# Patient Record
Sex: Male | Born: 1935 | Race: White | Hispanic: No | Marital: Married | Smoking: Never smoker
Health system: Southern US, Community
[De-identification: ages and names within clinical notes are randomized; demographics above are authoritative.]

## PROBLEM LIST (undated history)

## (undated) DIAGNOSIS — I420 Dilated cardiomyopathy: Secondary | ICD-10-CM

## (undated) DIAGNOSIS — M1990 Unspecified osteoarthritis, unspecified site: Secondary | ICD-10-CM

## (undated) DIAGNOSIS — I209 Angina pectoris, unspecified: Secondary | ICD-10-CM

## (undated) DIAGNOSIS — E785 Hyperlipidemia, unspecified: Secondary | ICD-10-CM

## (undated) DIAGNOSIS — I5022 Chronic systolic (congestive) heart failure: Secondary | ICD-10-CM

## (undated) DIAGNOSIS — D649 Anemia, unspecified: Secondary | ICD-10-CM

## (undated) DIAGNOSIS — E039 Hypothyroidism, unspecified: Secondary | ICD-10-CM

## (undated) DIAGNOSIS — E559 Vitamin D deficiency, unspecified: Secondary | ICD-10-CM

## (undated) DIAGNOSIS — I447 Left bundle-branch block, unspecified: Secondary | ICD-10-CM

## (undated) DIAGNOSIS — I10 Essential (primary) hypertension: Secondary | ICD-10-CM

## (undated) DIAGNOSIS — I6523 Occlusion and stenosis of bilateral carotid arteries: Secondary | ICD-10-CM

## (undated) HISTORY — PX: COLONOSCOPY: SHX174

---

## 2004-04-22 ENCOUNTER — Ambulatory Visit: Payer: Self-pay | Admitting: Unknown Physician Specialty

## 2005-12-19 ENCOUNTER — Ambulatory Visit: Payer: Self-pay

## 2006-03-24 ENCOUNTER — Ambulatory Visit: Payer: Self-pay | Admitting: Internal Medicine

## 2008-04-12 ENCOUNTER — Ambulatory Visit: Payer: Self-pay | Admitting: Internal Medicine

## 2008-04-16 ENCOUNTER — Ambulatory Visit: Payer: Self-pay | Admitting: Unknown Physician Specialty

## 2008-05-04 ENCOUNTER — Ambulatory Visit: Payer: Self-pay | Admitting: Internal Medicine

## 2008-05-12 ENCOUNTER — Ambulatory Visit: Payer: Self-pay | Admitting: Internal Medicine

## 2008-06-12 ENCOUNTER — Ambulatory Visit: Payer: Self-pay | Admitting: Internal Medicine

## 2008-06-18 ENCOUNTER — Ambulatory Visit: Payer: Self-pay | Admitting: Internal Medicine

## 2008-07-13 ENCOUNTER — Ambulatory Visit: Payer: Self-pay | Admitting: Internal Medicine

## 2015-01-26 DIAGNOSIS — Z0000 Encounter for general adult medical examination without abnormal findings: Secondary | ICD-10-CM | POA: Diagnosis not present

## 2015-01-26 DIAGNOSIS — L570 Actinic keratosis: Secondary | ICD-10-CM | POA: Diagnosis not present

## 2015-01-26 DIAGNOSIS — E039 Hypothyroidism, unspecified: Secondary | ICD-10-CM | POA: Insufficient documentation

## 2015-04-12 DIAGNOSIS — L219 Seborrheic dermatitis, unspecified: Secondary | ICD-10-CM | POA: Diagnosis not present

## 2015-07-22 DIAGNOSIS — Z125 Encounter for screening for malignant neoplasm of prostate: Secondary | ICD-10-CM | POA: Diagnosis not present

## 2015-07-22 DIAGNOSIS — L570 Actinic keratosis: Secondary | ICD-10-CM | POA: Diagnosis not present

## 2015-07-22 DIAGNOSIS — E039 Hypothyroidism, unspecified: Secondary | ICD-10-CM | POA: Diagnosis not present

## 2015-07-22 DIAGNOSIS — Z0000 Encounter for general adult medical examination without abnormal findings: Secondary | ICD-10-CM | POA: Diagnosis not present

## 2015-07-29 DIAGNOSIS — I10 Essential (primary) hypertension: Secondary | ICD-10-CM | POA: Insufficient documentation

## 2015-07-29 DIAGNOSIS — E039 Hypothyroidism, unspecified: Secondary | ICD-10-CM | POA: Diagnosis not present

## 2016-03-28 DIAGNOSIS — I447 Left bundle-branch block, unspecified: Secondary | ICD-10-CM | POA: Insufficient documentation

## 2016-03-30 DIAGNOSIS — I208 Other forms of angina pectoris: Secondary | ICD-10-CM | POA: Diagnosis present

## 2016-03-30 DIAGNOSIS — I2089 Other forms of angina pectoris: Secondary | ICD-10-CM

## 2016-03-30 HISTORY — DX: Other forms of angina pectoris: I20.89

## 2016-03-31 ENCOUNTER — Encounter: Payer: Self-pay | Admitting: *Deleted

## 2016-03-31 ENCOUNTER — Ambulatory Visit
Admission: RE | Admit: 2016-03-31 | Discharge: 2016-03-31 | Disposition: A | Discharge status: Home / Self Care | Payer: Medicare Other | Source: Ambulatory Visit | Attending: Internal Medicine | Admitting: Internal Medicine

## 2016-03-31 ENCOUNTER — Encounter
Admission: RE | Disposition: A | Discharge status: Home / Self Care | Payer: Self-pay | Source: Ambulatory Visit | Attending: Internal Medicine

## 2016-03-31 DIAGNOSIS — I447 Left bundle-branch block, unspecified: Secondary | ICD-10-CM | POA: Insufficient documentation

## 2016-03-31 DIAGNOSIS — R079 Chest pain, unspecified: Secondary | ICD-10-CM | POA: Insufficient documentation

## 2016-03-31 DIAGNOSIS — Z87891 Personal history of nicotine dependence: Secondary | ICD-10-CM | POA: Diagnosis not present

## 2016-03-31 DIAGNOSIS — I2510 Atherosclerotic heart disease of native coronary artery without angina pectoris: Secondary | ICD-10-CM

## 2016-03-31 DIAGNOSIS — I208 Other forms of angina pectoris: Secondary | ICD-10-CM | POA: Diagnosis present

## 2016-03-31 DIAGNOSIS — M1990 Unspecified osteoarthritis, unspecified site: Secondary | ICD-10-CM | POA: Diagnosis not present

## 2016-03-31 DIAGNOSIS — I10 Essential (primary) hypertension: Secondary | ICD-10-CM | POA: Insufficient documentation

## 2016-03-31 DIAGNOSIS — I2089 Other forms of angina pectoris: Secondary | ICD-10-CM | POA: Diagnosis present

## 2016-03-31 HISTORY — DX: Angina pectoris, unspecified: I20.9

## 2016-03-31 HISTORY — DX: Hyperlipidemia, unspecified: E78.5

## 2016-03-31 HISTORY — DX: Hypothyroidism, unspecified: E03.9

## 2016-03-31 HISTORY — DX: Essential (primary) hypertension: I10

## 2016-03-31 HISTORY — DX: Atherosclerotic heart disease of native coronary artery without angina pectoris: I25.10

## 2016-03-31 HISTORY — DX: Unspecified osteoarthritis, unspecified site: M19.90

## 2016-03-31 HISTORY — PX: CARDIAC CATHETERIZATION: SHX172

## 2016-03-31 SURGERY — LEFT HEART CATH AND CORONARY ANGIOGRAPHY
Anesthesia: Moderate Sedation | Laterality: Left

## 2016-03-31 MED ORDER — HEPARIN (PORCINE) IN NACL 2-0.9 UNIT/ML-% IJ SOLN
INTRAMUSCULAR | Status: AC
  Filled 2016-03-31: qty 500

## 2016-03-31 MED ORDER — SODIUM CHLORIDE 0.9 % IV SOLN: 250.0000 mL | INTRAVENOUS | Status: DC | PRN

## 2016-03-31 MED ORDER — FENTANYL CITRATE (PF) 100 MCG/2ML IJ SOLN
INTRAMUSCULAR | Status: AC
  Filled 2016-03-31: qty 2

## 2016-03-31 MED ORDER — MIDAZOLAM HCL 2 MG/2ML IJ SOLN
INTRAMUSCULAR | Status: DC | PRN
  Administered 2016-03-31: 0.5 mg via INTRAVENOUS

## 2016-03-31 MED ORDER — ONDANSETRON HCL 4 MG/2ML IJ SOLN: 4.0000 mg | Freq: Four times a day (QID) | INTRAMUSCULAR | Status: DC | PRN

## 2016-03-31 MED ORDER — SODIUM CHLORIDE 0.9 % WEIGHT BASED INFUSION: 1.0000 mL/kg/h | INTRAVENOUS | Status: DC

## 2016-03-31 MED ORDER — SODIUM CHLORIDE 0.9 % WEIGHT BASED INFUSION
235.5000 mL/h | INTRAVENOUS | Status: DC
  Administered 2016-03-31: 3 mL/kg/h via INTRAVENOUS

## 2016-03-31 MED ORDER — SODIUM CHLORIDE 0.9% FLUSH: 3.0000 mL | Freq: Two times a day (BID) | INTRAVENOUS | Status: DC

## 2016-03-31 MED ORDER — MIDAZOLAM HCL 2 MG/2ML IJ SOLN
INTRAMUSCULAR | Status: AC
  Filled 2016-03-31: qty 2

## 2016-03-31 MED ORDER — ACETAMINOPHEN 325 MG PO TABS: 650.0000 mg | ORAL_TABLET | ORAL | Status: DC | PRN

## 2016-03-31 MED ORDER — IOPAMIDOL (ISOVUE-300) INJECTION 61%
INTRAVENOUS | Status: DC | PRN
  Administered 2016-03-31: 120 mL via INTRA_ARTERIAL

## 2016-03-31 MED ORDER — SODIUM CHLORIDE 0.9% FLUSH: 3.0000 mL | INTRAVENOUS | Status: DC | PRN

## 2016-03-31 MED ORDER — SODIUM CHLORIDE 0.9 % WEIGHT BASED INFUSION: 3.0000 mL/kg/h | INTRAVENOUS | Status: DC

## 2016-03-31 MED ORDER — ASPIRIN 81 MG PO CHEW: 81.0000 mg | CHEWABLE_TABLET | ORAL | Status: DC

## 2016-03-31 SURGICAL SUPPLY — 9 items
CATH 5FR JL4 DIAGNOSTIC (CATHETERS) ×3 IMPLANT
CATH INFINITI 5FR ANG PIGTAIL (CATHETERS) ×3 IMPLANT
CATH INFINITI JR4 5F (CATHETERS) ×3 IMPLANT
DEVICE CLOSURE MYNXGRIP 5F (Vascular Products) ×3 IMPLANT
KIT MANI 3VAL PERCEP (MISCELLANEOUS) ×3 IMPLANT
NEEDLE PERC 18GX7CM (NEEDLE) ×3 IMPLANT
PACK CARDIAC CATH (CUSTOM PROCEDURE TRAY) ×3 IMPLANT
SHEATH PINNACLE 5F 10CM (SHEATH) ×3 IMPLANT
WIRE EMERALD 3MM-J .035X150CM (WIRE) ×3 IMPLANT

## 2016-03-31 NOTE — Progress Notes (Signed)
MD notified of patient's pulse 50s since admission pre-op and 40-50s post-op. Patient's home medication include metoprolol BID. MD order to stop medication until patient's follow up in office in 2 weeks.

## 2016-04-07 DIAGNOSIS — E782 Mixed hyperlipidemia: Secondary | ICD-10-CM | POA: Insufficient documentation

## 2016-04-07 DIAGNOSIS — I2510 Atherosclerotic heart disease of native coronary artery without angina pectoris: Secondary | ICD-10-CM | POA: Insufficient documentation

## 2016-04-27 DIAGNOSIS — I420 Dilated cardiomyopathy: Secondary | ICD-10-CM | POA: Insufficient documentation

## 2018-02-26 ENCOUNTER — Ambulatory Visit: Payer: Medicare HMO | Admitting: Certified Registered Nurse Anesthetist

## 2018-02-26 ENCOUNTER — Ambulatory Visit
Admission: RE | Admit: 2018-02-26 | Discharge: 2018-02-26 | Disposition: A | Discharge status: Home / Self Care | Payer: Medicare HMO | Source: Ambulatory Visit | Attending: Internal Medicine | Admitting: Internal Medicine

## 2018-02-26 ENCOUNTER — Encounter: Payer: Self-pay | Admitting: *Deleted

## 2018-02-26 ENCOUNTER — Encounter
Admission: RE | Disposition: A | Discharge status: Home / Self Care | Payer: Self-pay | Source: Ambulatory Visit | Attending: Internal Medicine

## 2018-02-26 DIAGNOSIS — K208 Other esophagitis: Secondary | ICD-10-CM | POA: Insufficient documentation

## 2018-02-26 DIAGNOSIS — R1013 Epigastric pain: Secondary | ICD-10-CM | POA: Diagnosis not present

## 2018-02-26 DIAGNOSIS — K449 Diaphragmatic hernia without obstruction or gangrene: Secondary | ICD-10-CM | POA: Insufficient documentation

## 2018-02-26 DIAGNOSIS — R634 Abnormal weight loss: Secondary | ICD-10-CM | POA: Diagnosis not present

## 2018-02-26 DIAGNOSIS — I10 Essential (primary) hypertension: Secondary | ICD-10-CM | POA: Diagnosis not present

## 2018-02-26 DIAGNOSIS — Z79899 Other long term (current) drug therapy: Secondary | ICD-10-CM | POA: Insufficient documentation

## 2018-02-26 DIAGNOSIS — K2960 Other gastritis without bleeding: Secondary | ICD-10-CM | POA: Insufficient documentation

## 2018-02-26 DIAGNOSIS — Z6824 Body mass index (BMI) 24.0-24.9, adult: Secondary | ICD-10-CM | POA: Diagnosis not present

## 2018-02-26 DIAGNOSIS — R1310 Dysphagia, unspecified: Secondary | ICD-10-CM | POA: Diagnosis not present

## 2018-02-26 HISTORY — PX: ESOPHAGOGASTRODUODENOSCOPY (EGD) WITH PROPOFOL: SHX5813

## 2018-02-26 SURGERY — ESOPHAGOGASTRODUODENOSCOPY (EGD) WITH PROPOFOL
Anesthesia: General

## 2018-02-26 MED ORDER — PROPOFOL 10 MG/ML IV BOLUS
INTRAVENOUS | Status: DC | PRN
  Administered 2018-02-26: 20 mg via INTRAVENOUS
  Administered 2018-02-26: 50 mg via INTRAVENOUS

## 2018-02-26 MED ORDER — LIDOCAINE HCL (PF) 2 % IJ SOLN
INTRAMUSCULAR | Status: AC
  Filled 2018-02-26: qty 10

## 2018-02-26 MED ORDER — PROPOFOL 500 MG/50ML IV EMUL
INTRAVENOUS | Status: AC
  Filled 2018-02-26: qty 50

## 2018-02-26 MED ORDER — LIDOCAINE HCL (CARDIAC) PF 100 MG/5ML IV SOSY
PREFILLED_SYRINGE | INTRAVENOUS | Status: DC | PRN
  Administered 2018-02-26: 100 mg via INTRAVENOUS

## 2018-02-26 MED ORDER — PROPOFOL 500 MG/50ML IV EMUL
INTRAVENOUS | Status: DC | PRN
  Administered 2018-02-26: 150 ug/kg/min via INTRAVENOUS

## 2018-02-26 MED ORDER — SODIUM CHLORIDE 0.9 % IV SOLN
INTRAVENOUS | Status: DC
  Administered 2018-02-26: 1000 mL via INTRAVENOUS

## 2018-02-26 NOTE — Interval H&P Note (Signed)
History and Physical Interval Note:  02/26/2018 9:24 AM  Joel Spencer  has presented today for surgery, with the diagnosis of GERD DYSPHAGIA EPIG PAIN WT LOSS  The various methods of treatment have been discussed with the patient and family. After consideration of risks, benefits and other options for treatment, the patient has consented to  Procedure(s): ESOPHAGOGASTRODUODENOSCOPY (EGD) WITH PROPOFOL (N/A) as a surgical intervention .  The patient's history has been reviewed, patient examined, no change in status, stable for surgery.  I have reviewed the patient's chart and labs.  Questions were answered to the patient's satisfaction.     [place], [place]

## 2018-02-26 NOTE — Anesthesia Postprocedure Evaluation (Signed)
Anesthesia Post Note  Patient: Joel Spencer  Procedure(s) Performed: ESOPHAGOGASTRODUODENOSCOPY (EGD) WITH PROPOFOL (N/A )  Patient location during evaluation: Endoscopy Anesthesia Type: General Level of consciousness: awake and alert Pain management: pain level controlled Vital Signs Assessment: post-procedure vital signs reviewed and stable Respiratory status: spontaneous breathing, nonlabored ventilation, respiratory function stable and patient connected to nasal cannula oxygen Cardiovascular status: blood pressure returned to baseline and stable Postop Assessment: no apparent nausea or vomiting Anesthetic complications: no     Last Vitals:  Vitals:   02/26/18 0939 02/26/18 1035  BP: (!) 165/97 113/76  Pulse: (!) 56 (!) 57  Resp: 16 18  Temp: 36.7 C (!) 36.1 C  SpO2: 100% 97%    Last Pain:  Vitals:   02/26/18 1116  TempSrc:   PainSc: 0-No pain                 Cleda MccreedyJoseph K Travis Mastel

## 2018-02-26 NOTE — H&P (Signed)
Outpatient short stay form Pre-procedure 02/26/2018 9:22 AM Racine Erby K. Norma Fredricksonoledo, M.D.  Primary Physician: Reuben LikesVishwananth Hande, M.D.  Reason for visit: Epigastric pain , weight loss, dysphagia  History of present illness:  82 y/o male presents with the above complaints. 30 lb weight loss and solid food Has epigastric pain with spicyt foods and dysphagia to the mid sternum.   No current facility-administered medications for this encounter.   Medications Prior to Admission  Medication Sig Dispense Refill Last Dose  . amLODipine (NORVASC) 5 MG tablet Take 5 mg by mouth daily.   03/31/2016 at Unknown time  . desonide (DESOWEN) 0.05 % ointment Apply 1 application topically 2 (two) times daily as needed.   Past Week at Unknown time  . isosorbide mononitrate (IMDUR) 30 MG 24 hr tablet Take 30 mg by mouth daily.   03/31/2016 at Unknown time  . ketoconazole (NIZORAL) 2 % cream Apply 1 application topically daily as needed for irritation.   Past Week at Unknown time  . metoprolol tartrate (LOPRESSOR) 25 MG tablet Take 25 mg by mouth 2 (two) times daily.   03/31/2016 at Unknown time     No Known Allergies   Past Medical History:  Diagnosis Date  . Anginal pain (HCC)   . Arthritis   . Hyperlipemia   . Hypertension   . Hypothyroidism     Review of systems:  Otherwise negative.    Physical Exam  Gen: Alert, oriented. Appears stated age.  HEENT: [place]/AT. PERRLA. Lungs: CTA, no wheezes. CV: RR nl S1, S2. Abd: soft, benign, no masses. BS+ Ext: No edema. Pulses 2+    Planned procedures: Proceed with EGD and colonoscopy. The patient understands the nature of the planned procedure, indications, risks, alternatives and potential complications including but not limited to bleeding, infection, perforation, damage to internal organs and possible oversedation/side effects from anesthesia. The patient agrees and gives consent to proceed.  Please refer to procedure notes for findings, recommendations  and patient disposition/instructions.     Shayde Gervacio K. Norma Fredricksonoledo, M.D. Gastroenterology 02/26/2018  9:22 AM

## 2018-02-26 NOTE — Anesthesia Procedure Notes (Signed)
Performed by: Louvina Cleary, CRNA Pre-anesthesia Checklist: Patient identified, Emergency Drugs available, Suction available, Patient being monitored and Timeout performed Patient Re-evaluated:Patient Re-evaluated prior to induction Oxygen Delivery Method: Nasal cannula Induction Type: IV induction       

## 2018-02-26 NOTE — Op Note (Signed)
[facility] Gastroenterology Patient Name: Joel Spencer Procedure Date: 02/26/2018 10:21 AM MRN: [id number] Account #: [id number] Date of Birth: 1936/05/21 Admit Type: Outpatient Age: 82 Room: [facility] ENDO ROOM 3 Gender: Male Note Status: Finalized Procedure:            Upper GI endoscopy Indications:          Epigastric abdominal pain, Dysphagia, Weight loss Providers:            Boykin Nearing. Norma Fredrickson MD, MD Referring MD:         Barbette Reichmann, MD (Referring MD) Medicines:            Propofol per Anesthesia Complications:        No immediate complications. Procedure:            Pre-Anesthesia Assessment:                       - The risks and benefits of the procedure and the                        sedation options and risks were discussed with the                        patient. All questions were answered and informed                        consent was obtained.                       - Patient identification and proposed procedure were                        verified prior to the procedure by the nurse. The                        procedure was verified in the procedure room.                       - ASA Grade Assessment: II - A patient with mild                        systemic disease.                       - After reviewing the risks and benefits, the patient                        was deemed in satisfactory condition to undergo the                        procedure.                       After obtaining informed consent, the endoscope was                        passed under direct vision. Throughout the procedure,                        the patient's blood pressure, pulse, and oxygen  saturations were monitored continuously. The Endoscope                        was introduced through the mouth, and advanced to the                        third part of duodenum. The upper GI endoscopy was                        accomplished without difficulty.  The patient tolerated                        the procedure well. Findings:      Mucosal changes including feline appearance were found in the middle       third of the esophagus and in the lower third of the esophagus.       Esophageal findings were graded using the Eosinophilic Esophagitis       Endoscopic Reference Score (EoE-EREFS) as: Edema Grade 0 Normal       (distinct vascular markings), Rings Grade 1 Mild (subtle circumferential       ridges seen on esophageal distension), Exudates Grade 0 None (no white       lesions seen), Furrows Grade 0 None (no vertical lines seen) and       Stricture none (no stricture found). Biopsies were obtained from the       proximal and distal esophagus with cold forceps for histology of       suspected eosinophilic esophagitis.      Localized moderate inflammation characterized by congestion (edema) and       erythema was found in the gastric antrum. Biopsies were taken with a       cold forceps for Helicobacter pylori testing.      A 2 cm hiatal hernia was present.      The examined duodenum was normal.      The exam was otherwise without abnormality. Impression:           - Esophageal mucosal changes suspicious for                        eosinophilic esophagitis. Biopsied.                       - Gastritis. Biopsied.                       - 2 cm hiatal hernia.                       - Normal examined duodenum.                       - The examination was otherwise normal. Recommendation:       - Patient has a contact number available for                        emergencies. The signs and symptoms of potential                        delayed complications were discussed with the patient.  Return to normal activities tomorrow. Written discharge                        instructions were provided to the patient.                       - Resume previous diet.                       - Continue present medications.                        - Await pathology results.                       - Return to physician assistant in 1 month.                       - The findings and recommendations were discussed with                        the patient and their family. Procedure Code(s):    --- Professional ---                       [phone], Esophagogastroduodenoscopy, flexible, transoral;                        with biopsy, single or multiple Diagnosis Code(s):    --- Professional ---                       R63.4, Abnormal weight loss                       R13.10, Dysphagia, unspecified                       R10.13, Epigastric pain                       K44.9, Diaphragmatic hernia without obstruction or                        gangrene                       K29.70, Gastritis, unspecified, without bleeding                       K22.8, Other specified diseases of esophagus CPT copyright 2017 American Medical Association. All rights reserved. The codes documented in this report are preliminary and upon coder review may  be revised to meet current compliance requirements. Stanton Kidneyeodoro K Violet Cart MD, MD 02/26/2018 10:36:47 AM This report has been signed electronically. Number of Addenda: 0 Note Initiated On: 02/26/2018 10:21 AM      [facility]

## 2018-02-26 NOTE — Transfer of Care (Signed)
Immediate Anesthesia Transfer of Care Note  Patient: Joel Spencer  Procedure(s) Performed: ESOPHAGOGASTRODUODENOSCOPY (EGD) WITH PROPOFOL (N/A )  Patient Location: PACU  Anesthesia Type:General  Level of Consciousness: drowsy  Airway & Oxygen Therapy: Patient Spontanous Breathing  Post-op Assessment: Report given to RN and Post -op Vital signs reviewed and stable  Post vital signs: Reviewed and stable  Last Vitals:  Vitals Value Taken Time  BP    Temp    Pulse 59 02/26/2018 10:35 AM  Resp 14 02/26/2018 10:35 AM  SpO2 99 % 02/26/2018 10:35 AM  Vitals shown include unvalidated device data.  Last Pain:  Vitals:   02/26/18 0939  TempSrc: Oral  PainSc: 0-No pain         Complications: No apparent anesthesia complications

## 2018-02-26 NOTE — Anesthesia Post-op Follow-up Note (Signed)
Anesthesia QCDR form completed.        

## 2018-02-26 NOTE — Anesthesia Preprocedure Evaluation (Signed)
Anesthesia Evaluation  Patient identified by MRN, date of birth, ID band Patient awake    Reviewed: Allergy & Precautions, H&P , NPO status , Patient's Chart, lab work & pertinent test results  History of Anesthesia Complications Negative for: history of anesthetic complications  Airway Mallampati: III  TM Distance: <3 FB Neck ROM: limited    Dental  (+) Chipped, Upper Dentures, Poor Dentition, Missing, Partial Lower   Pulmonary neg pulmonary ROS, neg shortness of breath,           Cardiovascular Exercise Tolerance: Good hypertension, (-) angina     Neuro/Psych negative neurological ROS  negative psych ROS   GI/Hepatic negative GI ROS, Neg liver ROS,   Endo/Other  Hypothyroidism   Renal/GU negative Renal ROS  negative genitourinary   Musculoskeletal  (+) Arthritis ,   Abdominal   Peds  Hematology negative hematology ROS (+)   Anesthesia Other Findings Past Medical History: No date: Anginal pain (HCC) No date: Arthritis No date: Hyperlipemia No date: Hypertension No date: Hypothyroidism  Past Surgical History: 03/31/2016: CARDIAC CATHETERIZATION; Left     Comment:  Procedure: Left Heart Cath and Coronary Angiography;                Surgeon: Lamar BlinksBruce J Kowalski, MD;  Location: [facility] INVASIVE               CV LAB;  Service: Cardiovascular;  Laterality: Left;  BMI    Body Mass Index:  24.02 kg/m      Reproductive/Obstetrics negative OB ROS                             Anesthesia Physical Anesthesia Plan  ASA: III  Anesthesia Plan: General   Post-op Pain Management:    Induction: Intravenous  PONV Risk Score and Plan: Propofol infusion and TIVA  Airway Management Planned: Natural Airway and Nasal Cannula  Additional Equipment:   Intra-op Plan:   Post-operative Plan:   Informed Consent: I have reviewed the patients History and Physical, chart, labs and discussed the  procedure including the risks, benefits and alternatives for the proposed anesthesia with the patient or authorized representative who has indicated his/her understanding and acceptance.   Dental Advisory Given  Plan Discussed with: Anesthesiologist, CRNA and Surgeon  Anesthesia Plan Comments: (Patient consented for risks of anesthesia including but not limited to:  - adverse reactions to medications - risk of intubation if required - damage to teeth, lips or other oral mucosa - sore throat or hoarseness - Damage to heart, brain, lungs or loss of life  Patient voiced understanding.)        Anesthesia Quick Evaluation

## 2018-02-27 ENCOUNTER — Other Ambulatory Visit: Payer: Self-pay | Admitting: Internal Medicine

## 2018-02-28 LAB — SURGICAL PATHOLOGY

## 2018-04-11 DIAGNOSIS — I6523 Occlusion and stenosis of bilateral carotid arteries: Secondary | ICD-10-CM | POA: Insufficient documentation

## 2019-01-10 DIAGNOSIS — I5022 Chronic systolic (congestive) heart failure: Secondary | ICD-10-CM | POA: Insufficient documentation

## 2019-03-12 DIAGNOSIS — Z95810 Presence of automatic (implantable) cardiac defibrillator: Secondary | ICD-10-CM

## 2019-03-12 HISTORY — PX: CARDIAC DEFIBRILLATOR PLACEMENT: SHX171

## 2019-03-12 HISTORY — DX: Presence of automatic (implantable) cardiac defibrillator: Z95.810

## 2019-04-07 ENCOUNTER — Encounter: Payer: Self-pay | Admitting: Emergency Medicine

## 2019-04-07 ENCOUNTER — Emergency Department
Admission: EM | Admit: 2019-04-07 | Discharge: 2019-04-07 | Disposition: A | Discharge status: Home / Self Care | Payer: Medicare HMO | Attending: Emergency Medicine | Admitting: Emergency Medicine

## 2019-04-07 DIAGNOSIS — R109 Unspecified abdominal pain: Secondary | ICD-10-CM | POA: Diagnosis present

## 2019-04-07 DIAGNOSIS — Z5321 Procedure and treatment not carried out due to patient leaving prior to being seen by health care provider: Secondary | ICD-10-CM | POA: Insufficient documentation

## 2019-04-07 NOTE — ED Triage Notes (Signed)
Pt c/o left lower abdominal pain that radiates into the back. Symptoms started this afternoon and have been worse tonight. Pt had 1 episode of emesis.

## 2019-04-16 DIAGNOSIS — Z4502 Encounter for adjustment and management of automatic implantable cardiac defibrillator: Secondary | ICD-10-CM | POA: Insufficient documentation

## 2019-08-07 DIAGNOSIS — E7800 Pure hypercholesterolemia, unspecified: Secondary | ICD-10-CM | POA: Insufficient documentation

## 2020-01-23 DIAGNOSIS — Z8616 Personal history of COVID-19: Secondary | ICD-10-CM

## 2020-01-23 HISTORY — DX: Personal history of COVID-19: Z86.16

## 2020-02-02 ENCOUNTER — Ambulatory Visit (HOSPITAL_COMMUNITY)
Admission: RE | Admit: 2020-02-02 | Discharge: 2020-02-02 | Disposition: A | Discharge status: Home / Self Care | Payer: Medicare Other | Source: Ambulatory Visit | Attending: Pulmonary Disease | Admitting: Pulmonary Disease

## 2020-02-02 ENCOUNTER — Other Ambulatory Visit (HOSPITAL_COMMUNITY): Payer: Self-pay | Admitting: Oncology

## 2020-02-02 DIAGNOSIS — Z23 Encounter for immunization: Secondary | ICD-10-CM | POA: Insufficient documentation

## 2020-02-02 DIAGNOSIS — U071 COVID-19: Secondary | ICD-10-CM | POA: Diagnosis present

## 2020-02-02 MED ORDER — SODIUM CHLORIDE 0.9 % IV SOLN: INTRAVENOUS | Status: DC | PRN

## 2020-02-02 MED ORDER — ALBUTEROL SULFATE HFA 108 (90 BASE) MCG/ACT IN AERS: 2.0000 | INHALATION_SPRAY | Freq: Once | RESPIRATORY_TRACT | Status: DC | PRN

## 2020-02-02 MED ORDER — EPINEPHRINE 0.3 MG/0.3ML IJ SOAJ: 0.3000 mg | Freq: Once | INTRAMUSCULAR | Status: DC | PRN

## 2020-02-02 MED ORDER — FAMOTIDINE IN NACL 20-0.9 MG/50ML-% IV SOLN: 20.0000 mg | Freq: Once | INTRAVENOUS | Status: DC | PRN

## 2020-02-02 MED ORDER — SODIUM CHLORIDE 0.9 % IV SOLN
1200.0000 mg | Freq: Once | INTRAVENOUS | Status: AC
  Administered 2020-02-02: 1200 mg via INTRAVENOUS
  Filled 2020-02-02: qty 10

## 2020-02-02 MED ORDER — METHYLPREDNISOLONE SODIUM SUCC 125 MG IJ SOLR: 125.0000 mg | Freq: Once | INTRAMUSCULAR | Status: DC | PRN

## 2020-02-02 MED ORDER — DIPHENHYDRAMINE HCL 50 MG/ML IJ SOLN: 50.0000 mg | Freq: Once | INTRAMUSCULAR | Status: DC | PRN

## 2020-02-02 NOTE — Progress Notes (Signed)
I connected by phone with  Mr. Hogeland at 3:30pm on 02/02/20 to discuss the potential use of an new treatment for mild to moderate COVID-19 viral infection in non-hospitalized patients.   This patient is a age/sex that meets the FDA criteria for Emergency Use Authorization of casirivimab\imdevimab.  Has a (+) direct SARS-CoV-2 viral test result 1. Has mild or moderate COVID-19  2. Is ? 82 years of age and weighs ? 40 kg 3. Is NOT hospitalized due to COVID-19 4. Is NOT requiring oxygen therapy or requiring an increase in baseline oxygen flow rate due to COVID-19 5. Is within 10 days of symptom onset 6. Has at least one of the high risk factor(s) for progression to severe COVID-19 and/or hospitalization as defined in EUA. ? Specific high risk criteria :age 10   Symptom onset  01/25/20   I have spoken and communicated the following to the patient or parent/caregiver:   1. FDA has authorized the emergency use of casirivimab\imdevimab for the treatment of mild to moderate COVID-19 in adults and pediatric patients with positive results of direct SARS-CoV-2 viral testing who are 45 years of age and older weighing at least 40 kg, and who are at high risk for progressing to severe COVID-19 and/or hospitalization.   2. The significant known and potential risks and benefits of casirivimab\imdevimab, and the extent to which such potential risks and benefits are unknown.   3. Information on available alternative treatments and the risks and benefits of those alternatives, including clinical trials.   4. Patients treated with casirivimab\imdevimab should continue to self-isolate and use infection control measures (e.g., wear mask, isolate, social distance, avoid sharing personal items, clean and disinfect "high touch" surfaces, and frequent handwashing) according to CDC guidelines.    5. The patient or parent/caregiver has the option to accept or refuse casirivimab\imdevimab .   After reviewing this  information with the patient, The patient agreed to proceed with receiving casirivimab\imdevimab infusion and will be provided a copy of the Fact sheet prior to receiving the infusion.Mignon Pine, AGNP-C [phone] (Infusion Center Hotline)

## 2020-02-02 NOTE — Progress Notes (Signed)
  Diagnosis: COVID-19  Physician:DR Wright  Procedure: [facility] Med: casirivimab\imdevimab infusion - Provided patient with casirivimab\imdevimab fact sheet for patients, parents and caregivers prior to infusion.  Complications: No immediate complications noted.  Discharge: Discharged home   Melizza Kanode Ramos 02/02/2020  

## 2020-02-02 NOTE — Discharge Instructions (Signed)

## 2020-05-17 DIAGNOSIS — Z8616 Personal history of COVID-19: Secondary | ICD-10-CM | POA: Insufficient documentation

## 2020-09-14 ENCOUNTER — Other Ambulatory Visit: Payer: Self-pay | Admitting: Internal Medicine

## 2020-09-14 DIAGNOSIS — R1032 Left lower quadrant pain: Secondary | ICD-10-CM

## 2020-09-14 DIAGNOSIS — R634 Abnormal weight loss: Secondary | ICD-10-CM

## 2020-09-22 ENCOUNTER — Ambulatory Visit: Payer: Medicare Other

## 2020-11-11 ENCOUNTER — Other Ambulatory Visit: Payer: Self-pay | Admitting: Internal Medicine

## 2020-11-11 DIAGNOSIS — Z8616 Personal history of COVID-19: Secondary | ICD-10-CM

## 2020-11-11 DIAGNOSIS — E7800 Pure hypercholesterolemia, unspecified: Secondary | ICD-10-CM

## 2020-11-11 DIAGNOSIS — R5383 Other fatigue: Secondary | ICD-10-CM

## 2020-11-11 DIAGNOSIS — K5900 Constipation, unspecified: Secondary | ICD-10-CM

## 2020-11-26 ENCOUNTER — Other Ambulatory Visit: Payer: Self-pay

## 2020-11-26 ENCOUNTER — Ambulatory Visit
Admission: RE | Admit: 2020-11-26 | Discharge: 2020-11-26 | Disposition: A | Discharge status: Home / Self Care | Payer: Medicare HMO | Source: Ambulatory Visit | Attending: Internal Medicine | Admitting: Internal Medicine

## 2020-11-26 DIAGNOSIS — Z8616 Personal history of COVID-19: Secondary | ICD-10-CM

## 2020-11-26 DIAGNOSIS — R5383 Other fatigue: Secondary | ICD-10-CM

## 2020-11-26 DIAGNOSIS — R5381 Other malaise: Secondary | ICD-10-CM | POA: Diagnosis present

## 2020-11-26 DIAGNOSIS — K5900 Constipation, unspecified: Secondary | ICD-10-CM | POA: Diagnosis not present

## 2020-11-26 DIAGNOSIS — E7800 Pure hypercholesterolemia, unspecified: Secondary | ICD-10-CM | POA: Insufficient documentation

## 2020-11-26 LAB — POCT I-STAT CREATININE: Creatinine, Ser: 0.8 mg/dL (ref 0.61–1.24)

## 2020-11-26 MED ORDER — IOHEXOL 300 MG/ML  SOLN
80.0000 mL | Freq: Once | INTRAMUSCULAR | Status: AC | PRN
  Administered 2020-11-26: 80 mL via INTRAVENOUS

## 2020-12-10 ENCOUNTER — Other Ambulatory Visit (HOSPITAL_COMMUNITY): Payer: Self-pay | Admitting: Internal Medicine

## 2020-12-10 ENCOUNTER — Other Ambulatory Visit: Payer: Self-pay | Admitting: Internal Medicine

## 2020-12-10 DIAGNOSIS — R935 Abnormal findings on diagnostic imaging of other abdominal regions, including retroperitoneum: Secondary | ICD-10-CM

## 2020-12-10 DIAGNOSIS — M519 Unspecified thoracic, thoracolumbar and lumbosacral intervertebral disc disorder: Secondary | ICD-10-CM

## 2020-12-27 ENCOUNTER — Other Ambulatory Visit: Payer: Self-pay

## 2020-12-27 ENCOUNTER — Ambulatory Visit
Admission: RE | Admit: 2020-12-27 | Discharge: 2020-12-27 | Disposition: A | Discharge status: Home / Self Care | Payer: Medicare HMO | Source: Ambulatory Visit | Attending: Internal Medicine | Admitting: Internal Medicine

## 2020-12-27 ENCOUNTER — Encounter
Admission: RE | Admit: 2020-12-27 | Discharge: 2020-12-27 | Disposition: A | Discharge status: Home / Self Care | Payer: Medicare HMO | Source: Ambulatory Visit | Attending: Internal Medicine | Admitting: Internal Medicine

## 2020-12-27 DIAGNOSIS — M519 Unspecified thoracic, thoracolumbar and lumbosacral intervertebral disc disorder: Secondary | ICD-10-CM | POA: Diagnosis present

## 2020-12-27 DIAGNOSIS — R935 Abnormal findings on diagnostic imaging of other abdominal regions, including retroperitoneum: Secondary | ICD-10-CM | POA: Diagnosis present

## 2020-12-27 MED ORDER — TECHNETIUM TC 99M MEDRONATE IV KIT
20.0000 | PACK | Freq: Once | INTRAVENOUS | Status: AC | PRN
  Administered 2020-12-27: 20.16 via INTRAVENOUS

## 2021-01-28 NOTE — Addendum Note (Signed)
Encounter addended by: Novella Olive on: 01/28/2021 11:11 AM  Actions taken: Letter saved

## 2022-03-28 ENCOUNTER — Encounter: Payer: Self-pay | Admitting: Internal Medicine

## 2022-03-28 ENCOUNTER — Emergency Department: Payer: Medicare HMO

## 2022-03-28 ENCOUNTER — Inpatient Hospital Stay
Admission: EM | Admit: 2022-03-28 | Discharge: 2022-03-31 | DRG: 872 | Disposition: A | Discharge status: Home / Self Care | Payer: Medicare HMO | Attending: Hospitalist | Admitting: Hospitalist

## 2022-03-28 ENCOUNTER — Other Ambulatory Visit: Payer: Self-pay

## 2022-03-28 DIAGNOSIS — I25118 Atherosclerotic heart disease of native coronary artery with other forms of angina pectoris: Secondary | ICD-10-CM | POA: Diagnosis present

## 2022-03-28 DIAGNOSIS — I10 Essential (primary) hypertension: Secondary | ICD-10-CM | POA: Diagnosis present

## 2022-03-28 DIAGNOSIS — E785 Hyperlipidemia, unspecified: Secondary | ICD-10-CM | POA: Diagnosis present

## 2022-03-28 DIAGNOSIS — E872 Acidosis, unspecified: Secondary | ICD-10-CM | POA: Diagnosis present

## 2022-03-28 DIAGNOSIS — A419 Sepsis, unspecified organism: Secondary | ICD-10-CM | POA: Diagnosis not present

## 2022-03-28 DIAGNOSIS — I5022 Chronic systolic (congestive) heart failure: Secondary | ICD-10-CM | POA: Diagnosis present

## 2022-03-28 DIAGNOSIS — R6520 Severe sepsis without septic shock: Secondary | ICD-10-CM | POA: Diagnosis present

## 2022-03-28 DIAGNOSIS — K810 Acute cholecystitis: Secondary | ICD-10-CM | POA: Diagnosis present

## 2022-03-28 DIAGNOSIS — Z7982 Long term (current) use of aspirin: Secondary | ICD-10-CM

## 2022-03-28 DIAGNOSIS — Z79899 Other long term (current) drug therapy: Secondary | ICD-10-CM

## 2022-03-28 DIAGNOSIS — M1990 Unspecified osteoarthritis, unspecified site: Secondary | ICD-10-CM | POA: Diagnosis present

## 2022-03-28 DIAGNOSIS — Z91148 Patient's other noncompliance with medication regimen for other reason: Secondary | ICD-10-CM

## 2022-03-28 DIAGNOSIS — I110 Hypertensive heart disease with heart failure: Secondary | ICD-10-CM | POA: Diagnosis present

## 2022-03-28 DIAGNOSIS — E039 Hypothyroidism, unspecified: Secondary | ICD-10-CM | POA: Diagnosis not present

## 2022-03-28 DIAGNOSIS — I2089 Other forms of angina pectoris: Secondary | ICD-10-CM | POA: Diagnosis not present

## 2022-03-28 DIAGNOSIS — Z91128 Patient's intentional underdosing of medication regimen for other reason: Secondary | ICD-10-CM

## 2022-03-28 DIAGNOSIS — K819 Cholecystitis, unspecified: Principal | ICD-10-CM

## 2022-03-28 HISTORY — DX: Chronic systolic (congestive) heart failure: I50.22

## 2022-03-28 LAB — CBC
HCT: 45.4 % (ref 39.0–52.0)
Hemoglobin: 15.5 g/dL (ref 13.0–17.0)
MCH: 29.6 pg (ref 26.0–34.0)
MCHC: 34.1 g/dL (ref 30.0–36.0)
MCV: 86.8 fL (ref 80.0–100.0)
Platelets: 220 10*3/uL (ref 150–400)
RBC: 5.23 MIL/uL (ref 4.22–5.81)
RDW: 13.4 % (ref 11.5–15.5)
WBC: 19.7 10*3/uL — ABNORMAL HIGH (ref 4.0–10.5)
nRBC: 0 % (ref 0.0–0.2)

## 2022-03-28 LAB — COMPREHENSIVE METABOLIC PANEL
ALT: 20 U/L (ref 0–44)
AST: 32 U/L (ref 15–41)
Albumin: 4.3 g/dL (ref 3.5–5.0)
Alkaline Phosphatase: 74 U/L (ref 38–126)
Anion gap: 14 (ref 5–15)
BUN: 23 mg/dL (ref 8–23)
CO2: 19 mmol/L — ABNORMAL LOW (ref 22–32)
Calcium: 9.7 mg/dL (ref 8.9–10.3)
Chloride: 104 mmol/L (ref 98–111)
Creatinine, Ser: 1.1 mg/dL (ref 0.61–1.24)
GFR, Estimated: >60 mL/min (ref >60)
Glucose, Bld: 188 mg/dL — ABNORMAL HIGH (ref 70–99)
Potassium: 4 mmol/L (ref 3.5–5.1)
Sodium: 137 mmol/L (ref 135–145)
Total Bilirubin: 1.7 mg/dL — ABNORMAL HIGH (ref 0.3–1.2)
Total Protein: 8 g/dL (ref 6.5–8.1)

## 2022-03-28 LAB — APTT: aPTT: 33 seconds (ref 24–36)

## 2022-03-28 LAB — TYPE AND SCREEN
ABO/RH(D): O POS
Antibody Screen: NEGATIVE

## 2022-03-28 LAB — LACTIC ACID, PLASMA
Lactic Acid, Venous: 4.3 mmol/L (ref 0.5–1.9)
Lactic Acid, Venous: 3.2 mmol/L (ref 0.5–1.9)
Lactic Acid, Venous: 2.3 mmol/L (ref 0.5–1.9)

## 2022-03-28 LAB — PROTIME-INR
INR: 1.5 — ABNORMAL HIGH (ref 0.8–1.2)
Prothrombin Time: 17.5 seconds — ABNORMAL HIGH (ref 11.4–15.2)

## 2022-03-28 LAB — PROCALCITONIN: Procalcitonin: 1.73 ng/mL

## 2022-03-28 LAB — LIPASE, BLOOD: Lipase: 26 U/L (ref 11–51)

## 2022-03-28 LAB — BRAIN NATRIURETIC PEPTIDE: B Natriuretic Peptide: 493.8 pg/mL — ABNORMAL HIGH (ref 0.0–100.0)

## 2022-03-28 MED ORDER — SODIUM CHLORIDE 0.9 % IV SOLN
2.0000 g | Freq: Once | INTRAVENOUS | Status: AC
  Administered 2022-03-28: 2 g via INTRAVENOUS
  Filled 2022-03-28: qty 12.5

## 2022-03-28 MED ORDER — SODIUM CHLORIDE 0.9 % IV BOLUS
1000.0000 mL | Freq: Once | INTRAVENOUS | Status: AC
  Administered 2022-03-28: 1000 mL via INTRAVENOUS

## 2022-03-28 MED ORDER — VANCOMYCIN HCL 1750 MG/350ML IV SOLN
1750.0000 mg | Freq: Once | INTRAVENOUS | Status: DC
  Filled 2022-03-28: qty 350

## 2022-03-28 MED ORDER — HYDRALAZINE HCL 20 MG/ML IJ SOLN: 5.0000 mg | INTRAMUSCULAR | Status: DC | PRN

## 2022-03-28 MED ORDER — ACETAMINOPHEN 325 MG PO TABS
650.0000 mg | ORAL_TABLET | Freq: Four times a day (QID) | ORAL | Status: DC | PRN
  Administered 2022-03-28 – 2022-03-29 (×2): 650 mg via ORAL
  Filled 2022-03-28 (×2): qty 2

## 2022-03-28 MED ORDER — OXYCODONE-ACETAMINOPHEN 5-325 MG PO TABS
1.0000 | ORAL_TABLET | ORAL | Status: DC | PRN
  Administered 2022-03-28 – 2022-03-29 (×2): 1 via ORAL
  Filled 2022-03-28 (×2): qty 1

## 2022-03-28 MED ORDER — SODIUM CHLORIDE 0.9 % IV BOLUS
500.0000 mL | Freq: Once | INTRAVENOUS | Status: AC
  Administered 2022-03-28: 500 mL via INTRAVENOUS

## 2022-03-28 MED ORDER — SODIUM CHLORIDE 0.9 % IV SOLN: INTRAVENOUS | Status: DC

## 2022-03-28 MED ORDER — IOHEXOL 300 MG/ML  SOLN
100.0000 mL | Freq: Once | INTRAMUSCULAR | Status: AC | PRN
  Administered 2022-03-28: 100 mL via INTRAVENOUS

## 2022-03-28 MED ORDER — PIPERACILLIN-TAZOBACTAM 3.375 G IVPB
3.3750 g | Freq: Three times a day (TID) | INTRAVENOUS | Status: DC
  Administered 2022-03-28 – 2022-03-31 (×8): 3.375 g via INTRAVENOUS
  Filled 2022-03-28 (×8): qty 50

## 2022-03-28 MED ORDER — MORPHINE SULFATE (PF) 2 MG/ML IV SOLN: 0.5000 mg | INTRAVENOUS | Status: DC | PRN

## 2022-03-28 MED ORDER — ONDANSETRON HCL 4 MG/2ML IJ SOLN: 4.0000 mg | Freq: Three times a day (TID) | INTRAMUSCULAR | Status: DC | PRN

## 2022-03-28 MED ORDER — MORPHINE SULFATE (PF) 2 MG/ML IV SOLN
1.0000 mg | INTRAVENOUS | Status: DC | PRN
  Administered 2022-03-28 – 2022-03-29 (×3): 1 mg via INTRAVENOUS
  Filled 2022-03-28 (×4): qty 1

## 2022-03-28 NOTE — ED Provider Notes (Signed)
[facility] Provider Note    Event Date/Time   First MD Initiated Contact with Patient 03/28/22 1506     (approximate)   History   Vomiting   HPI  Joel Spencer is a 86 y.o. male   Past medical history of hypertension, CHF, hyperlipidemia and hypothyroid who presents to the emergency department with 3 days of abdominal pain right upper quadrant, nausea and vomiting.  No bowel movement for approximately 1 week.  No blood in the emesis.  No history of abdominal surgeries.    No fever, chills, urinary symptoms.  History was obtained via the patient and family member at the bedside      Physical Exam   Triage Vital Signs: ED Triage Vitals [03/28/22 0958]  Enc Vitals Group     BP (!) 137/97     Pulse Rate (!) 120     Resp 18     Temp 98.4 F (36.9 C)     Temp Source Oral     SpO2 95 %     Weight 160 lb (72.6 kg)     Height 5\' 8"  (1.727 m)     Head Circumference      Peak Flow      Pain Score 10     Pain Loc      Pain Edu?      Excl. in [place]?     Most recent vital signs: Vitals:   03/28/22 1600 03/28/22 1839  BP: (!) 141/92   Pulse: (!) 118   Resp: (!) 29   Temp:  98.4 F (36.9 C)  SpO2: 94%     General: Awake, no distress.  CV:  Good peripheral perfusion.  Resp:  Normal effort.  Abd:  No distention.  Tenderness to palpation right upper quadrant. Other:  Nontoxic-appearing, mentation appropriate, alert and oriented and pleasant.   ED Results / Procedures / Treatments   Labs (all labs ordered are listed, but only abnormal results are displayed) Labs Reviewed  CBC - Abnormal; Notable for the following components:      Result Value   WBC 19.7 (*)    All other components within normal limits  COMPREHENSIVE METABOLIC PANEL - Abnormal; Notable for the following components:   CO2 19 (*)    Glucose, Bld 188 (*)    Total Bilirubin 1.7 (*)    All other components within normal limits  LACTIC ACID, PLASMA - Abnormal; Notable for  the following components:   Lactic Acid, Venous 4.3 (*)    All other components within normal limits  PROTIME-INR - Abnormal; Notable for the following components:   Prothrombin Time 17.5 (*)    INR 1.5 (*)    All other components within normal limits  BRAIN NATRIURETIC PEPTIDE - Abnormal; Notable for the following components:   B Natriuretic Peptide 493.8 (*)    All other components within normal limits  CULTURE, BLOOD (ROUTINE X 2)  CULTURE, BLOOD (ROUTINE X 2)  LIPASE, BLOOD  APTT  URINALYSIS, ROUTINE W REFLEX MICROSCOPIC  LACTIC ACID, PLASMA  LACTIC ACID, PLASMA  LACTIC ACID, PLASMA  LACTIC ACID, PLASMA  PROCALCITONIN  BASIC METABOLIC PANEL  CBC  TYPE AND SCREEN     I reviewed labs and they are notable for blood cell count 19.7.  EKG  ED ECG REPORT I, Lucillie Garfinkel, the attending physician, personally viewed and interpreted this ECG.   Date: 03/28/2022  EKG Time: 0958  Rate: 120  Rhythm: A sensed v  paced   Intervals:no ischemic changes    RADIOLOGY I independently reviewed and interpreted cT of the abdomen pelvis and see a distended gallbladder with inflammatory changes surrounding.   PROCEDURES:  Critical Care performed: Yes, see critical care procedure note(s)  .Critical Care  Performed by: Pilar Jarvis, MD Authorized by: Pilar Jarvis, MD   Critical care provider statement:    Critical care time (minutes):  30   Critical care was necessary to treat or prevent imminent or life-threatening deterioration of the following conditions:  Sepsis   Critical care was time spent personally by me on the following activities:  Development of treatment plan with patient or surrogate, discussions with consultants, evaluation of patient's response to treatment, examination of patient, ordering and review of laboratory studies, ordering and review of radiographic studies, ordering and performing treatments and interventions, pulse oximetry, re-evaluation of patient's condition  and review of old charts   Care discussed with: admitting provider      MEDICATIONS ORDERED IN ED: Medications  0.9 %  sodium chloride infusion ( Intravenous New Bag/Given 03/28/22 1905)  ondansetron (ZOFRAN) injection 4 mg (has no administration in time range)  acetaminophen (TYLENOL) tablet 650 mg (has no administration in time range)  hydrALAZINE (APRESOLINE) injection 5 mg (has no administration in time range)  oxyCODONE-acetaminophen (PERCOCET/ROXICET) 5-325 MG per tablet 1 tablet (has no administration in time range)  piperacillin-tazobactam (ZOSYN) IVPB 3.375 g (3.375 g Intravenous New Bag/Given 03/28/22 1917)  morphine (PF) 2 MG/ML injection 1 mg (1 mg Intravenous Given 03/28/22 1917)  sodium chloride 0.9 % bolus 1,000 mL (0 mLs Intravenous Stopped 03/28/22 1808)  sodium chloride 0.9 % bolus 1,000 mL (0 mLs Intravenous Stopped 03/28/22 1904)  ceFEPIme (MAXIPIME) 2 g in sodium chloride 0.9 % 100 mL IVPB (0 g Intravenous Stopped 03/28/22 1735)  iohexol (OMNIPAQUE) 300 MG/ML solution 100 mL (100 mLs Intravenous Contrast Given 03/28/22 1624)  sodium chloride 0.9 % bolus 500 mL (500 mLs Intravenous New Bag/Given 03/28/22 1905)    Consultants:  I spoke with surgery consultant Dr.Piscoya Regarding care plan for this patient.   IMPRESSION / MDM / ASSESSMENT AND PLAN / ED COURSE  I reviewed the triage vital signs and the nursing notes.                              Differential diagnosis includes, but is not limited to, abdominal infection, obstruction, sepsis,    The patient is on the cardiac monitor to evaluate for evidence of arrhythmia and/or significant heart rate changes.  MDM: CT scan of the abdomen pelvis shows distended gallbladder with inflammatory changes surrounding, Dr. Aleen Campi consulted from surgery, recommend IR drainage of the gallbladder in the morning and admit to hospitalist service.  He already received broad-spectrum antibiotics and sepsis bolus for ideal body  weight, patient remains overall well-appearing and nontoxic and understands the plan.   Patient's presentation is most consistent with acute presentation with potential threat to life or bodily function.       FINAL CLINICAL IMPRESSION(S) / ED DIAGNOSES   Final diagnoses:  Cholecystitis     Rx / DC Orders   ED Discharge Orders     None        Note:  This document was prepared using Dragon voice recognition software and may include unintentional dictation errors.    Pilar Jarvis, MD 03/28/22 1924

## 2022-03-28 NOTE — Assessment & Plan Note (Addendum)
Severe sepsis due to acute cholecystitis: Patient meets criteria for severe sepsis with WBC 19.7, heart rate 120, RR 29, lactic acid 4.3.  CT scan findings are consistent with cholecystitis.  There is concern for a possible hepatic flexure perforated diverticulitis with fistula to the gallbladder, but per Dr. Hampton Abbot, this is less likely given short/acute presentation. Per Dr. Hampton Abbot, "will consult radiology in AM for perc chole drain placement".   -Admitted to PCU as inpatient -Antibiotics: IV Zosyn (patient received 1 dose of cefepime and vancomycin in ED) -Blood culture -Trend lactic acid level -Check procalcitonin level -Pain control: As needed morphine and Percocet, Tylenol -As needed Zofran -IV fluid: 2.5 L normal saline, then 75 cc/h (patient has sCHF with EF of 35%, limiting aggressive IV fluid treatment)

## 2022-03-28 NOTE — Consult Note (Signed)
PHARMACY -  BRIEF ANTIBIOTIC NOTE   Pharmacy has received consult(s) for vancomycin from an ED provider.  The patient's profile has been reviewed for ht/wt/allergies/indication/available labs.    One time order(s) placed for vancomycin 1,750 mg IV x 1  Further antibiotics/pharmacy consults should be ordered by admitting physician if indicated.                       Thank you, Dara Hoyer, PharmD PGY-1 Pharmacy Resident 03/28/2022 4:34 PM

## 2022-03-28 NOTE — Assessment & Plan Note (Signed)
-  Not taking medications currently °-Follow-up with PCP °

## 2022-03-28 NOTE — Assessment & Plan Note (Signed)
-  see above 

## 2022-03-28 NOTE — Assessment & Plan Note (Signed)
Patient is not taking medications currently.  Blood pressure 141/92.  Patient is at high risk of developing hypotension due to severe sepsis. -IV hydralazine as needed

## 2022-03-28 NOTE — Progress Notes (Addendum)
03/28/22  Discussed patient with Dr. Jacelyn Grip.  Patient comes in with 3 days of RUQ abdominal pain, nausea, vomiting.  CT scan in ED shows significant inflammatory changes in RUQ with distended gallbladder with gas inside gallbladder, likely consistent with cholecystitis.  There is concern for a possible hepatic flexure perforated diverticulitis with fistula to the gallbladder, but would say this is less likely given short/acute presentation.  Would favor management with cholecystostomy drain given significant inflammatory changes. Recommend admission to hospitalist team.  Will consult radiology in AM for perc chole drain placement.  For now, would keep NPO, IV fluids, IV abx.  Full note in AM.  Olean Ree, MD

## 2022-03-28 NOTE — ED Notes (Signed)
When pt falls asleep o2 drops below 90%. Pt in 2L Joel Spencer

## 2022-03-28 NOTE — ED Triage Notes (Signed)
Pt in with co right flank pain for 2 days started after vomiting. Pt states worse when he takes a deep breath. Denies any diarrhea but did have temp of 99.5

## 2022-03-28 NOTE — Assessment & Plan Note (Signed)
History of CAD and stable angina: Currently no chest pain.  Patient is not taking any medications currently -Monitor closely

## 2022-03-28 NOTE — H&P (Signed)
History and Physical    Joel Spencer BDZ:[id number] DOB: 08/24/1935 DOA: 03/28/2022  Referring MD/NP/PA:   PCP: Barbette Reichmann, MD   Patient coming from:  The patient is coming from home.     Chief Complaint: Abdominal pain, nausea, vomiting  HPI: Joel Spencer is a 86 y.o. male with medical history significant of hypertension, hyperlipidemia, CAD, stable angina, sCHF with EF of 35%, hypothyroidism, medication noncompliance, who presents with abdominal pain, nausea, vomiting.  Patient states that his abdominal pain has been going on for more than 2 days, which has been progressively worsening.  The abdominal pain is located in right upper quadrant, sharp, 10 out of 10 in severity, nonradiating, aggravated by deep breath and movement.  Associated with nausea and nonbilious nonbloody vomiting.  Patient states that he has vomited numerous times.  No diarrhea.  Patient has subjective mild fever and chills.  Denies chest pain, cough, shortness breath.  No symptoms of UTI.  Of note, patient stopped taking all his medications for a long time.  Currently patient is only taking as needed Tylenol.  Data reviewed independently and ED Course: pt was found to have WBC 19.7, lactic acid 4.3, INR 1.5, PTT 33, liver function okay, lipase 26, GFR> 60, temperature normal, blood pressure 141/92, heart rate 120, RR 18, RR 29, oxygen saturation 93-95% on room air. CT scan in ED showed cholecystitis with a concern for a possible hepatic flexure perforated diverticulitis with fistula to the gallbladder. Patient is admitted to PCU as inpatient.  Dr. Aleen Campi of surgery is consulted.  CT abdomen/pelvis: 1. Distended gallbladder with moderate air and considerable pericholecystic inflammatory change. No calcified stones. Wall thickening and inflammation of adjacent hepatic flexure with diverticula present. Diagnostic considerations include emphysematous cholecystitis with secondary reactive wall thickening of  the adjacent colon (favored diagnosis) versus right-sided diverticulitis with cholecystocolonic fistula (blurred fat plane between the gallbladder wall and adjacent inflamed colon.) Surgical consultation is recommended. No signs of free air to suggest perforation. 2. Small free fluid in the pelvis and upper abdomen.   Critical Value/emergent results were called by telephone at the time of interpretation on 03/28/2022 at 4:51 pm to provider [facility] [facility] , who verbally acknowledged these results.  EKG: I have personally reviewed.  Paced rhythm, QTc 514.   Review of Systems:   General: has fevers, chills, no body weight gain, has poor appetite, has fatigue HEENT: no blurry vision, hearing changes or sore throat Respiratory: no dyspnea, coughing, wheezing CV: no chest pain, no palpitations GI: has nausea, vomiting, abdominal pain, diarrhea, constipation GU: no dysuria, burning on urination, increased urinary frequency, hematuria  Ext: no leg edema Neuro: no unilateral weakness, numbness, or tingling, no vision change or hearing loss Skin: no rash, no skin tear. MSK: No muscle spasm, no deformity, no limitation of range of movement in spin Heme: No easy bruising.  Travel history: No recent long distant travel.   Allergy: No Known Allergies  Past Medical History:  Diagnosis Date   Anginal pain (HCC)    Arthritis    Chronic systolic CHF (congestive heart failure) (HCC)    Hyperlipemia    Hypertension    Hypothyroidism    Stable angina 03/30/2016    Past Surgical History:  Procedure Laterality Date   CARDIAC CATHETERIZATION Left 03/31/2016   Procedure: Left Heart Cath and Coronary Angiography;  Surgeon: Lamar Blinks, MD;  Location: [facility] INVASIVE CV LAB;  Service: Cardiovascular;  Laterality: Left;   ESOPHAGOGASTRODUODENOSCOPY (EGD) WITH PROPOFOL N/A  02/26/2018   Procedure: ESOPHAGOGASTRODUODENOSCOPY (EGD) WITH PROPOFOL;  Surgeon: Toledo, Benay Pike, MD;  Location: [facility]  ENDOSCOPY;  Service: Gastroenterology;  Laterality: N/A;    Social History:  reports that he has never smoked. He has never used smokeless tobacco. He reports that he does not drink alcohol and does not use drugs.  Family History:  Family History  Problem Relation Age of Onset   Bone cancer Mother    Diabetes Father      Prior to Admission medications   Medication Sig Start Date End Date Taking? Authorizing Provider  amLODipine (NORVASC) 5 MG tablet Take 5 mg by mouth daily.    [provider]  aspirin 325 MG EC tablet Take 325 mg by mouth every 6 (six) hours as needed for pain.    [provider]  desonide (DESOWEN) 0.05 % ointment Apply 1 application topically 2 (two) times daily as needed.    [provider]  isosorbide mononitrate (IMDUR) 30 MG 24 hr tablet Take 30 mg by mouth daily.    [provider]  ketoconazole (NIZORAL) 2 % cream Apply 1 application topically daily as needed for irritation.    [provider]  metoprolol tartrate (LOPRESSOR) 25 MG tablet Take 25 mg by mouth 2 (two) times daily.    [provider]    Physical Exam: Vitals:   03/28/22 0958 03/28/22 1413 03/28/22 1600  BP: (!) 137/97 (!) 121/91 (!) 141/92  Pulse: (!) 120 (!) 109 (!) 118  Resp: 18 18 (!) 29  Temp: 98.4 F (36.9 C) 98.2 F (36.8 C)   TempSrc: Oral Oral   SpO2: 95% 93% 94%  Weight: 72.6 kg    Height: 5\' 8"  (1.727 m)     General: Patient is in acute distress due to severe abdominal pain HEENT:       Eyes: PERRL, EOMI, no scleral icterus.       ENT: No discharge from the ears and nose, no pharynx injection, no tonsillar enlargement.        Neck: No JVD, no bruit, no mass felt. Heme: No neck lymph node enlargement. Cardiac: S1/S2, RRR, No murmurs, No gallops or rubs. Respiratory: No rales, wheezing, rhonchi or rubs. GI: Has tenderness in the right upper quadrant, with guarding on examination, no organomegaly, BS present. GU: No  hematuria Ext: No pitting leg edema bilaterally. 1+DP/PT pulse bilaterally. Musculoskeletal: No joint deformities, No joint redness or warmth, no limitation of ROM in spin. Skin: No rashes.  Neuro: Alert, oriented X3, cranial nerves II-XII grossly intact, moves all extremities normally.  Psych: Patient is not psychotic, no suicidal or hemocidal ideation.  Labs on Admission: I have personally reviewed following labs and imaging studies  CBC: Recent Labs  Lab 03/28/22 1004  WBC 19.7*  HGB 15.5  HCT 45.4  MCV 86.8  PLT 706   Basic Metabolic Panel: Recent Labs  Lab 03/28/22 1004  NA 137  K 4.0  CL 104  CO2 19*  GLUCOSE 188*  BUN 23  CREATININE 1.10  CALCIUM 9.7   GFR: Estimated Creatinine Clearance: 48.4 mL/min (by C-G formula based on SCr of 1.1 mg/dL). Liver Function Tests: Recent Labs  Lab 03/28/22 1004  AST 32  ALT 20  ALKPHOS 74  BILITOT 1.7*  PROT 8.0  ALBUMIN 4.3   Recent Labs  Lab 03/28/22 1004  LIPASE 26   No results for input(s): "AMMONIA" in the last 168 hours. Coagulation Profile: Recent Labs  Lab 03/28/22 1658  INR 1.5*   Cardiac Enzymes: No results for input(s): "CKTOTAL", "CKMB", "CKMBINDEX", "TROPONINI" in the last 168 hours. BNP (last 3 results) No results for input(s): "PROBNP" in the last 8760 hours. HbA1C: No results for input(s): "HGBA1C" in the last 72 hours. CBG: No results for input(s): "GLUCAP" in the last 168 hours. Lipid Profile: No results for input(s): "CHOL", "HDL", "LDLCALC", "TRIG", "CHOLHDL", "LDLDIRECT" in the last 72 hours. Thyroid Function Tests: No results for input(s): "TSH", "T4TOTAL", "FREET4", "T3FREE", "THYROIDAB" in the last 72 hours. Anemia Panel: No results for input(s): "VITAMINB12", "FOLATE", "FERRITIN", "TIBC", "IRON", "RETICCTPCT" in the last 72 hours. Urine analysis: No results found for: "COLORURINE", "APPEARANCEUR", "LABSPEC", "PHURINE", "GLUCOSEU", "HGBUR", "BILIRUBINUR", "KETONESUR", "PROTEINUR",  "UROBILINOGEN", "NITRITE", "LEUKOCYTESUR" Sepsis Labs: @LABRCNTIP (procalcitonin:4,lacticidven:4) )No results found for this or any previous visit (from the past 240 hour(s)).   Radiological Exams on Admission: CT Abdomen Pelvis W Contrast  Result Date: 03/28/2022 CLINICAL DATA:  Acute right flank pain EXAM: CT ABDOMEN AND PELVIS WITH CONTRAST TECHNIQUE: Multidetector CT imaging of the abdomen and pelvis was performed using the standard protocol following bolus administration of intravenous contrast. RADIATION DOSE REDUCTION: This exam was performed according to the departmental dose-optimization program which includes automated exposure control, adjustment of the mA and/or kV according to patient size and/or use of iterative reconstruction technique. CONTRAST:  03/30/2022 OMNIPAQUE IOHEXOL 300 MG/ML  SOLN COMPARISON:  CT 11/26/2020, bone scan 12/27/2020 FINDINGS: Lower chest: Lung bases demonstrate no acute airspace disease. Mild dependent atelectasis. Ectatic ascending aortic diameter up to 3.9 cm. Partially visualized cardiac pacing leads. Hepatobiliary: Circumscribed hypodense liver lesions without significant change. Posterior right hepatic lobe lesion is consistent with cyst containing thin septation. Left hepatic lobe lesion could reflect complicated cyst versus small hemangioma. Distended gallbladder with moderate air in the lumen. Considerable inflammation in the right upper quadrant. No biliary dilatation Pancreas: Unremarkable. No pancreatic ductal dilatation or surrounding inflammatory changes. Spleen: Normal in size without focal abnormality. Adrenals/Urinary Tract: Adrenal glands are normal. Bilateral parapelvic cysts, no imaging follow-up is recommended. No hydronephrosis. The bladder is unremarkable Stomach/Bowel: The stomach is nonenlarged. No dilated small bowel. Retrocecal appendix which is normal in caliber. Diffuse diverticular disease of the colon. Focal wall thickening of the hepatic  flexure with indistinct plane between the inflamed colon and gallbladder, coronal series 5, image 31. Vascular/Lymphatic: Nonaneurysmal aorta with atherosclerosis. No suspicious lymph nodes Reproductive: Prostate appears slightly enlarged Other: No free air.  Small free fluid in the pelvis. Musculoskeletal: No acute osseous abnormality. Similar small sclerotic foci within the L3 and L4 vertebra. IMPRESSION: 1. Distended gallbladder with moderate air and considerable pericholecystic inflammatory change. No calcified stones. Wall thickening and inflammation of adjacent hepatic flexure with diverticula present. Diagnostic considerations include emphysematous cholecystitis with secondary reactive wall thickening of the adjacent colon (favored diagnosis) versus right-sided diverticulitis with cholecystocolonic fistula (blurred fat plane between the gallbladder wall and adjacent inflamed colon.) Surgical consultation is recommended. No signs of free air to suggest perforation. 2. Small free fluid in the pelvis and upper abdomen. Critical Value/emergent results were called by telephone at the time of interpretation on 03/28/2022 at 4:51 pm to provider [facility] , who verbally acknowledged these results. Electronically Signed   By: [facility] M.D.   On: 03/28/2022 16:52      Assessment/Plan Principal Problem:   Acute cholecystitis Active Problems:   Severe sepsis (HCC)   Chronic systolic CHF (congestive heart failure) (HCC)   Hypertension   Stable angina   Hyperlipemia  Hypothyroidism   Assessment and Plan: * Acute cholecystitis Severe sepsis due to acute cholecystitis: Patient meets criteria for severe sepsis with WBC 19.7, heart rate 120, RR 29, lactic acid 4.3.  CT scan findings are consistent with cholecystitis.  There is concern for a possible hepatic flexure perforated diverticulitis with fistula to the gallbladder, but per Dr. Aleen Campi, this is less likely given short/acute presentation. Per Dr.  Aleen Campi, "will consult radiology in AM for perc chole drain placement".   -Admitted to PCU as inpatient -Antibiotics: IV Zosyn (patient received 1 dose of cefepime and vancomycin in ED) -Blood culture -Trend lactic acid level -Check procalcitonin level -Pain control: As needed morphine and Percocet, Tylenol -As needed Zofran -IV fluid: 2.5 L normal saline, then 75 cc/h (patient has sCHF with EF of 35%, limiting aggressive IV fluid treatment)      Severe sepsis (HCC) -see above  Chronic systolic CHF (congestive heart failure) (HCC) 2D echo on 09/09/2019 showed EF of 35%.  Patient does not have leg edema or JVD.  No shortness of breath.  Clinically no signs of CHF exacerbation -Check BNP -Watch volume status closely while patient is on IV fluid  Hypertension Patient is not taking medications currently.  Blood pressure 141/92.  Patient is at high risk of developing hypotension due to severe sepsis. -IV hydralazine as needed    Stable angina History of CAD and stable angina: Currently no chest pain.  Patient is not taking any medications currently -Monitor closely  Hyperlipemia Not taking medications currently -follow-up with PCP  Hypothyroidism Not taking medications currently -Follow-up with PCP          DVT ppx: SCD  Code Status: Full code  Family Communication:  Yes, patient's son and wife  at bed side.    Disposition Plan:  Anticipate discharge back to previous environment  Consults called:  Dr. Aleen Campi of surgery  Admission status and Level of care: Progressive:    as inpt        Dispo: The patient is from: Home              Anticipated d/c is to: Home              Anticipated d/c date is: 2 days              Patient currently is not medically stable to d/c.    Severity of Illness:  The appropriate patient status for this patient is INPATIENT. Inpatient status is judged to be reasonable and necessary in order to provide the required intensity  of service to ensure the patient's safety. The patient's presenting symptoms, physical exam findings, and initial radiographic and laboratory data in the context of their chronic comorbidities is felt to place them at high risk for further clinical deterioration. Furthermore, it is not anticipated that the patient will be medically stable for discharge from the hospital within 2 midnights of admission.   * I certify that at the point of admission it is my clinical judgment that the patient will require inpatient hospital care spanning beyond 2 midnights from the point of admission due to high intensity of service, high risk for further deterioration and high frequency of surveillance required.*       Date of Service 03/28/2022    Lorretta Harp [facility]   If 7PM-7AM, please contact night-coverage [email or web address] 03/28/2022, 6:30 PM

## 2022-03-28 NOTE — Assessment & Plan Note (Signed)
Not taking medications currently -follow-up with PCP

## 2022-03-28 NOTE — ED Notes (Signed)
IV attemtped on pt three times. RN notified provider and placed IV team order.

## 2022-03-28 NOTE — Consult Note (Signed)
Pharmacy Antibiotic Note  Joel Spencer is a 86 y.o. male admitted on 03/28/2022 with  intra-abdominal infection .  Pharmacy has been consulted for Zosyn dosing.  Assessment: 86 yo M with PMH arthritis, HLD, HTN, hypothyroidism presenting with 2-day h/o right flank pain and vomiting. Imaging c/f emphysematous cholecystitis.  Pt is afebrile, but tachycardic, tachypneic, with WBC 19.7. Lactic acid 4.3. Pt received cefepime 2 g IV x 1 in the ED but is now being switched to Zosyn for abdominal anaerobic coverage. Bcx have been collected. Renal function currently stable.  Plan: Initiate Zosyn 3.375 g IV q8H Monitor renal function to assess for any necessary changes in dosing regimen Monitor culture results to assess for opportunities to narrow therapy  Height: 5\' 8"  (172.7 cm) Weight: 72.6 kg (160 lb) IBW/kg (Calculated) : 68.4  Temp (24hrs), Avg:98.3 F (36.8 C), Min:98.2 F (36.8 C), Max:98.4 F (36.9 C)  Recent Labs  Lab 03/28/22 1004 03/28/22 1701  WBC 19.7*  --   CREATININE 1.10  --   LATICACIDVEN  --  4.3*    Estimated Creatinine Clearance: 48.4 mL/min (by C-G formula based on SCr of 1.1 mg/dL).    No Known Allergies  Antimicrobials this admission: Cefepime 10/17 x 1 Zosyn 10/17 >>   Dose adjustments this admission: N/A  Microbiology results: 10/17 BCx: sent  Thank you for allowing pharmacy to be a part of this patient's care.  Dara Hoyer, PharmD PGY-1 Pharmacy Resident 03/28/2022 5:55 PM

## 2022-03-28 NOTE — Assessment & Plan Note (Signed)
2D echo on 09/09/2019 showed EF of 35%.  Patient does not have leg edema or JVD.  No shortness of breath.  Clinically no signs of CHF exacerbation -Check BNP -Watch volume status closely while patient is on IV fluid

## 2022-03-29 ENCOUNTER — Inpatient Hospital Stay: Payer: Medicare HMO | Admitting: Radiology

## 2022-03-29 DIAGNOSIS — K810 Acute cholecystitis: Secondary | ICD-10-CM | POA: Diagnosis not present

## 2022-03-29 HISTORY — PX: IR PERC CHOLECYSTOSTOMY: IMG2326

## 2022-03-29 LAB — URINALYSIS, ROUTINE W REFLEX MICROSCOPIC
Bilirubin Urine: NEGATIVE
Glucose, UA: NEGATIVE mg/dL
Ketones, ur: NEGATIVE mg/dL
Leukocytes,Ua: NEGATIVE
Nitrite: NEGATIVE
Protein, ur: 100 mg/dL — AB
Specific Gravity, Urine: >1.046 — ABNORMAL HIGH (ref 1.005–1.030)
Squamous Epithelial / HPF: NONE SEEN (ref 0–5)
pH: 5 (ref 5.0–8.0)

## 2022-03-29 LAB — BASIC METABOLIC PANEL
Anion gap: 4 — ABNORMAL LOW (ref 5–15)
BUN: 28 mg/dL — ABNORMAL HIGH (ref 8–23)
CO2: 26 mmol/L (ref 22–32)
Calcium: 8.6 mg/dL — ABNORMAL LOW (ref 8.9–10.3)
Chloride: 110 mmol/L (ref 98–111)
Creatinine, Ser: 1.18 mg/dL (ref 0.61–1.24)
GFR, Estimated: >60 mL/min (ref >60)
Glucose, Bld: 126 mg/dL — ABNORMAL HIGH (ref 70–99)
Potassium: 4.9 mmol/L (ref 3.5–5.1)
Sodium: 140 mmol/L (ref 135–145)

## 2022-03-29 LAB — CBC
HCT: 37.8 % — ABNORMAL LOW (ref 39.0–52.0)
Hemoglobin: 12.7 g/dL — ABNORMAL LOW (ref 13.0–17.0)
MCH: 30.4 pg (ref 26.0–34.0)
MCHC: 33.6 g/dL (ref 30.0–36.0)
MCV: 90.4 fL (ref 80.0–100.0)
Platelets: 136 10*3/uL — ABNORMAL LOW (ref 150–400)
RBC: 4.18 MIL/uL — ABNORMAL LOW (ref 4.22–5.81)
RDW: 13.8 % (ref 11.5–15.5)
WBC: 14.6 10*3/uL — ABNORMAL HIGH (ref 4.0–10.5)
nRBC: 0 % (ref 0.0–0.2)

## 2022-03-29 LAB — CBG MONITORING, ED
Glucose-Capillary: 122 mg/dL — ABNORMAL HIGH (ref 70–99)
Glucose-Capillary: 109 mg/dL — ABNORMAL HIGH (ref 70–99)

## 2022-03-29 LAB — GLUCOSE, CAPILLARY: Glucose-Capillary: 118 mg/dL — ABNORMAL HIGH (ref 70–99)

## 2022-03-29 MED ORDER — IOHEXOL 300 MG/ML  SOLN
5.0000 mL | Freq: Once | INTRAMUSCULAR | Status: AC | PRN
  Administered 2022-03-29: 5 mL

## 2022-03-29 MED ORDER — ENOXAPARIN SODIUM 40 MG/0.4ML IJ SOSY
40.0000 mg | PREFILLED_SYRINGE | INTRAMUSCULAR | Status: DC
  Administered 2022-03-29 – 2022-03-30 (×2): 40 mg via SUBCUTANEOUS
  Filled 2022-03-29 (×2): qty 0.4

## 2022-03-29 MED ORDER — ASPIRIN 325 MG PO TBEC
325.0000 mg | DELAYED_RELEASE_TABLET | Freq: Every day | ORAL | Status: DC
  Administered 2022-03-30 – 2022-03-31 (×2): 325 mg via ORAL
  Filled 2022-03-29 (×2): qty 1

## 2022-03-29 MED ORDER — ACETAMINOPHEN 500 MG PO TABS
500.0000 mg | ORAL_TABLET | Freq: Once | ORAL | Status: AC
  Administered 2022-03-29: 500 mg via ORAL
  Filled 2022-03-29: qty 1

## 2022-03-29 MED ORDER — SODIUM CHLORIDE 0.9% FLUSH
5.0000 mL | Freq: Three times a day (TID) | INTRAVENOUS | Status: DC
  Administered 2022-03-29 – 2022-03-31 (×5): 5 mL

## 2022-03-29 MED ORDER — MIDAZOLAM HCL 5 MG/5ML IJ SOLN
INTRAMUSCULAR | Status: AC | PRN
  Administered 2022-03-29 (×2): .5 mg via INTRAVENOUS

## 2022-03-29 MED ORDER — LIDOCAINE HCL 1 % IJ SOLN
INTRAMUSCULAR | Status: AC
  Administered 2022-03-29: 8 mL
  Filled 2022-03-29: qty 20

## 2022-03-29 MED ORDER — FENTANYL CITRATE (PF) 100 MCG/2ML IJ SOLN
INTRAMUSCULAR | Status: AC
  Filled 2022-03-29: qty 2

## 2022-03-29 MED ORDER — MIDAZOLAM HCL 2 MG/2ML IJ SOLN
INTRAMUSCULAR | Status: AC
  Filled 2022-03-29: qty 2

## 2022-03-29 MED ORDER — FENTANYL CITRATE (PF) 100 MCG/2ML IJ SOLN
INTRAMUSCULAR | Status: AC | PRN
  Administered 2022-03-29 (×2): 25 ug via INTRAVENOUS

## 2022-03-29 MED ORDER — HYDRALAZINE HCL 20 MG/ML IJ SOLN: 10.0000 mg | Freq: Four times a day (QID) | INTRAMUSCULAR | Status: DC | PRN

## 2022-03-29 MED ORDER — SODIUM CHLORIDE 0.9 % IV SOLN
2.0000 g | INTRAVENOUS | Status: AC
  Filled 2022-03-29: qty 2

## 2022-03-29 NOTE — Consult Note (Addendum)
Pharmacy Antibiotic Note  Joel Spencer is a 86 y.o. male admitted on 03/28/2022 with  intra-abdominal infection .  Pharmacy has been consulted for Zosyn dosing.  Assessment: 86 yo M with PMH arthritis, HLD, HTN, hypothyroidism presenting with 2-day h/o right flank pain and vomiting. Imaging c/f emphysematous cholecystitis.  Pt is afebrile, but tachycardic, tachypneic, with WBC 19.7. Lactic acid 4.3. Pt received cefepime 2 g IV x 1 in the ED but is now being switched to Zosyn for abdominal anaerobic coverage. Bcx have been collected. On 10/18, patient underwent cholecystostomy drain placement, and aspirate from gallbladder lumen was sent for culture. Renal function currently stable but will continue to monitor because Scr mildly elevated above baseline; current Zosyn dosing remains appropriate.  Plan: 10/18: culture obtained from needle aspiration of bile from gallbladder lumen Continue Zosyn 3.375 g IV q8H Monitor renal function to assess for any necessary changes in dosing regimen Monitor culture results to assess for opportunities to narrow therapy  Height: 5\' 8"  (172.7 cm) Weight: 72.6 kg (160 lb) IBW/kg (Calculated) : 68.4  Temp (24hrs), Avg:98.7 F (37.1 C), Min:98 F (36.7 C), Max:101.6 F (38.7 C)  Recent Labs  Lab 03/28/22 1004 03/28/22 1701 03/28/22 1855 03/28/22 2116 03/29/22 0515  WBC 19.7*  --   --   --  14.6*  CREATININE 1.10  --   --   --  1.18  LATICACIDVEN  --  4.3* 3.2* 2.3*  --      Estimated Creatinine Clearance: 45.1 mL/min (by C-G formula based on SCr of 1.18 mg/dL).    No Known Allergies  Antimicrobials this admission: Cefepime 10/17 x 1 Zosyn 10/17 >>   Dose adjustments this admission: N/A  Microbiology results: 10/17 BCx: NGTD @ <24 hrs 10/18 Gallbladder aspirate: sent  Thank you for allowing pharmacy to be a part of this patient's care.  Dara Hoyer, PharmD PGY-1 Pharmacy Resident 03/29/2022 11:04 AM

## 2022-03-29 NOTE — ED Notes (Signed)
Provider, Posey Pronto MD made aware of pt being febrile after giving PRN tylenol. Pt has a temperature 102.2 Per provider, to give 500 mg of tylenol.

## 2022-03-29 NOTE — Consult Note (Signed)
Chief Complaint: Patient was seen in consultation today for  Chief Complaint  Patient presents with   Vomiting    Referring Physician(s): Dr. Hampton Abbot  Supervising Physician: Aletta Edouard  Patient Status: [facility] - ED  History of Present Illness: Joel Spencer is an 86 y.o. male with a medical history significant for CHF, stable angina and HTN. He presented to the [facility] with complaints of right upper quadrant pain, nausea and vomiting. Imaging showed a distended gallbladder. The patient was evaluated by surgery and given the inflammatory changes, a percutaneous cholecystostomy was recommended.    CT abdomen/pelvis with contrast 03/28/22 IMPRESSION: 1. Distended gallbladder with moderate air and considerable pericholecystic inflammatory change. No calcified stones. Wall thickening and inflammation of adjacent hepatic flexure with diverticula present. Diagnostic considerations include emphysematous cholecystitis with secondary reactive wall thickening of the adjacent colon (favored diagnosis) versus right-sided diverticulitis with cholecystocolonic fistula (blurred fat plane between the gallbladder wall and adjacent inflamed colon.) Surgical consultation is recommended. No signs of free air to suggest perforation. 2. Small free fluid in the pelvis and upper abdomen.  Interventional Radiology has been asked to evaluate this patient for an image-guided percutaneous cholecystostomy. Imaging reviewed and procedure approved by Dr. Clementeen Hoof.   Past Medical History:  Diagnosis Date   Anginal pain ([place])    Arthritis    Chronic systolic CHF (congestive heart failure) ([place])    Hyperlipemia    Hypertension    Hypothyroidism    Stable angina 03/30/2016    Past Surgical History:  Procedure Laterality Date   CARDIAC CATHETERIZATION Left 03/31/2016   Procedure: Left Heart Cath and Coronary Angiography;  Surgeon: Corey Skains, MD;  Location: [place] [facility];  Service:  Cardiovascular;  Laterality: Left;   ESOPHAGOGASTRODUODENOSCOPY (EGD) WITH PROPOFOL N/A 02/26/2018   Procedure: ESOPHAGOGASTRODUODENOSCOPY (EGD) WITH PROPOFOL;  Surgeon: Toledo, Benay Pike, MD;  Location: [facility] ENDOSCOPY;  Service: Gastroenterology;  Laterality: N/A;    Allergies: Patient has no known allergies.  Medications: Prior to Admission medications   Medication Sig Start Date End Date Taking? Authorizing Provider  amLODipine (NORVASC) 5 MG tablet Take 5 mg by mouth daily.    [provider]  aspirin 325 MG EC tablet Take 325 mg by mouth every 6 (six) hours as needed for pain.    [provider]  desonide (DESOWEN) 0.05 % ointment Apply 1 application topically 2 (two) times daily as needed.    [provider]  isosorbide mononitrate (IMDUR) 30 MG 24 hr tablet Take 30 mg by mouth daily.    [provider]  ketoconazole (NIZORAL) 2 % cream Apply 1 application topically daily as needed for irritation.    [provider]  metoprolol tartrate (LOPRESSOR) 25 MG tablet Take 25 mg by mouth 2 (two) times daily.    [provider]     Family History  Problem Relation Age of Onset   Bone cancer Mother    Diabetes Father     Social History   Socioeconomic History   Marital status: Married    Spouse name: Not on file   Number of children: Not on file   Years of education: Not on file   Highest education level: Not on file  Occupational History   Not on file  Tobacco Use   Smoking status: Never   Smokeless tobacco: Never  Substance and Sexual Activity   Alcohol use: No   Drug use: No   Sexual activity: Not on file  Other Topics  Concern   Not on file  Social History Narrative   Not on file   Social Determinants of Health   Financial Resource Strain: Not on file  Food Insecurity: Not on file  Transportation Needs: Not on file  Physical Activity: Not on file  Stress: Not on file  Social Connections: Not on file     Review of Systems: A 12 point ROS discussed and pertinent positives are indicated in the HPI above.  All other systems are negative.  Review of Systems  Constitutional:  Positive for appetite change and fatigue.  Respiratory:  Negative for cough and shortness of breath.   Cardiovascular:  Negative for chest pain and leg swelling.  Gastrointestinal:  Positive for abdominal pain. Negative for diarrhea, nausea and vomiting.  Musculoskeletal:  Negative for back pain.  Neurological:  Positive for headaches. Negative for dizziness.    Vital Signs: BP 94/61   Pulse 80   Temp 98 F (36.7 C) (Oral)   Resp 17   Ht 5\' 8"  (1.727 m)   Wt 160 lb (72.6 kg)   SpO2 98%   BMI 24.33 kg/m   Physical Exam Constitutional:      General: He is not in acute distress.    Appearance: He is not ill-appearing.  HENT:     Mouth/Throat:     Mouth: Mucous membranes are moist.     Pharynx: Oropharynx is clear.  Cardiovascular:     Rate and Rhythm: Normal rate and regular rhythm.  Pulmonary:     Effort: Pulmonary effort is normal.  Abdominal:     General: There is distension.  Musculoskeletal:     Right lower leg: No edema.     Left lower leg: No edema.  Skin:    General: Skin is warm and dry.  Neurological:     Mental Status: He is alert and oriented to person, place, and time.     Imaging: CT Abdomen Pelvis W Contrast  Result Date: 03/28/2022 CLINICAL DATA:  Acute right flank pain EXAM: CT ABDOMEN AND PELVIS WITH CONTRAST TECHNIQUE: Multidetector CT imaging of the abdomen and pelvis was performed using the standard protocol following bolus administration of intravenous contrast. RADIATION DOSE REDUCTION: This exam was performed according to the departmental dose-optimization program which includes automated exposure control, adjustment of the mA and/or kV according to patient size and/or use of iterative reconstruction technique. CONTRAST:  157mL OMNIPAQUE IOHEXOL 300 MG/ML  SOLN COMPARISON:   CT 11/26/2020, bone scan 12/27/2020 FINDINGS: Lower chest: Lung bases demonstrate no acute airspace disease. Mild dependent atelectasis. Ectatic ascending aortic diameter up to 3.9 cm. Partially visualized cardiac pacing leads. Hepatobiliary: Circumscribed hypodense liver lesions without significant change. Posterior right hepatic lobe lesion is consistent with cyst containing thin septation. Left hepatic lobe lesion could reflect complicated cyst versus small hemangioma. Distended gallbladder with moderate air in the lumen. Considerable inflammation in the right upper quadrant. No biliary dilatation Pancreas: Unremarkable. No pancreatic ductal dilatation or surrounding inflammatory changes. Spleen: Normal in size without focal abnormality. Adrenals/Urinary Tract: Adrenal glands are normal. Bilateral parapelvic cysts, no imaging follow-up is recommended. No hydronephrosis. The bladder is unremarkable Stomach/Bowel: The stomach is nonenlarged. No dilated small bowel. Retrocecal appendix which is normal in caliber. Diffuse diverticular disease of the colon. Focal wall thickening of the hepatic flexure with indistinct plane between the inflamed colon and gallbladder, coronal series 5, image 31. Vascular/Lymphatic: Nonaneurysmal aorta with atherosclerosis. No suspicious lymph nodes Reproductive: Prostate appears slightly enlarged Other: No free air.  Small free fluid in the pelvis. Musculoskeletal: No acute osseous abnormality. Similar small sclerotic foci within the L3 and L4 vertebra. IMPRESSION: 1. Distended gallbladder with moderate air and considerable pericholecystic inflammatory change. No calcified stones. Wall thickening and inflammation of adjacent hepatic flexure with diverticula present. Diagnostic considerations include emphysematous cholecystitis with secondary reactive wall thickening of the adjacent colon (favored diagnosis) versus right-sided diverticulitis with cholecystocolonic fistula (blurred fat  plane between the gallbladder wall and adjacent inflamed colon.) Surgical consultation is recommended. No signs of free air to suggest perforation. 2. Small free fluid in the pelvis and upper abdomen. Critical Value/emergent results were called by telephone at the time of interpretation on 03/28/2022 at 4:51 pm to provider [facility] , who verbally acknowledged these results. Electronically Signed   By: Donavan Foil M.D.   On: 03/28/2022 16:52    Labs:  CBC: Recent Labs    03/28/22 1004 03/29/22 0515  WBC 19.7* 14.6*  HGB 15.5 12.7*  HCT 45.4 37.8*  PLT 220 136*    COAGS: Recent Labs    03/28/22 1658  INR 1.5*  APTT 33    BMP: Recent Labs    03/28/22 1004 03/29/22 0515  NA 137 140  K 4.0 4.9  CL 104 110  CO2 19* 26  GLUCOSE 188* 126*  BUN 23 28*  CALCIUM 9.7 8.6*  CREATININE 1.10 1.18  GFRNONAA >60 >60    LIVER FUNCTION TESTS: Recent Labs    03/28/22 1004  BILITOT 1.7*  AST 32  ALT 20  ALKPHOS 74  PROT 8.0  ALBUMIN 4.3    TUMOR MARKERS: No results for input(s): "AFPTM", "CEA", "CA199", "CHROMGRNA" in the last 8760 hours.  Assessment and Plan:  Emphysematous cholecystitis: Joel Spencer, 86 year old male, is scheduled today for an image-guided percutaneous cholecystostomy tube. The procedure was discussed with the patient and his family in the ED.   Risks and benefits were discussed with the patient including, but not limited to, bleeding, infection, gallbladder perforation, bile leak, sepsis or even death.  All of the patient's questions were answered, patient is agreeable to proceed. He has been NPO.   Consent signed and the IR APP office.   Thank you for this interesting consult.  I greatly enjoyed meeting Joel Spencer and look forward to participating in their care.  A copy of this report was sent to the requesting provider on this date.  Electronically Signed: Soyla Dryer, AGACNP-BC [phone] 03/29/2022, 8:57 AM   I spent a  total of 20 Minutes    in face to face in clinical consultation, greater than 50% of which was counseling/coordinating care for percutaneous cholecystostomy tube

## 2022-03-29 NOTE — Procedures (Signed)
Interventional Radiology Procedure Note  Procedure: Percutaneous cholecystostomy  Complications: None  Estimated Blood Loss: None  Findings: Needle aspiration of bile from gallbladder lumen yielded dark, blood tinged bile. Sample sent for culture. 10 Fr drainage catheter placed in GB lumen and attached to gravity bag drainage.  Venetia Night. Kathlene Cote, M.D Pager:  [phone]

## 2022-03-29 NOTE — Consult Note (Signed)
[facility] SURGICAL CONSULTATION NOTE (initial) - cpt: 83151   HISTORY OF PRESENT ILLNESS (HPI):  86 y.o. male presented to [facility] ED yesterday for evaluation of emesis. Patient reports around a 3-day history of RUQ abdominal pain with associated nausea and emesis. No fever, chills, CP, SOB, bowel changes, urinary changes, nor juandice. No previous abdominal surgeries. Of note, he does have CAD, HTN, HLD, CHF and on full dose ASA. Work up in the ED revealed a leukocytosis to 19.7K (now 14.6K), renal function normal with sCr - 1.10, lipase normal at 26, he did have lactic acidosis to 4.3 (now 2.3), BCx are without growth. He did have CT Abdomen/Pelvis which showed distended gallbladder with pneumobilia and significant pericholecystitis inflammation. He was admitted to medicine and started on Zosyn   Surgery is consulted by emergency physician Dr. Pilar Jarvis, MD in this context for evaluation and management of cholecystitis.  PAST MEDICAL HISTORY (PMH):  Past Medical History:  Diagnosis Date   Anginal pain (H[facility])    Arthritis    Chronic systolic CHF (congestive heart failure) ([facility])    Hyperlipemia    Hypertension    Hypothyroidism    Stable angina 03/30/2016     PAST SURGICAL HISTORY [facility]):  Past Surgical History:  Procedure Laterality Date   CARDIAC CATHETERIZATION Left 03/31/2016   Procedure: Left Heart Cath and Coronary Angiography;  Surgeon: Lamar Blinks, MD;  Location: [facility] INVASIVE [facility] LAB;  Service: Cardiovascular;  Laterality: Left;   ESOPHAGOGASTRODUODENOSCOPY (EGD) WITH PROPOFOL N/A 02/26/2018   Procedure: ESOPHAGOGASTRODUODENOSCOPY (EGD) WITH PROPOFOL;  Surgeon: Toledo, Boykin Nearing, MD;  Location: [facility] ENDOSCOPY;  Service: Gastroenterology;  Laterality: N/A;     MEDICATIONS:  Prior to Admission medications   Medication Sig Start Date End Date Taking? Authorizing Provider  amLODipine (NORVASC) 5 MG tablet Take 5 mg by mouth daily.    [provider]   aspirin 325 MG EC tablet Take 325 mg by mouth every 6 (six) hours as needed for pain.    [provider]  desonide (DESOWEN) 0.05 % ointment Apply 1 application topically 2 (two) times daily as needed.    [provider]  isosorbide mononitrate (IMDUR) 30 MG 24 hr tablet Take 30 mg by mouth daily.    [provider]  ketoconazole (NIZORAL) 2 % cream Apply 1 application topically daily as needed for irritation.    [provider]  metoprolol tartrate (LOPRESSOR) 25 MG tablet Take 25 mg by mouth 2 (two) times daily.    [provider]     ALLERGIES:  No Known Allergies   SOCIAL HISTORY:  Social History   Socioeconomic History   Marital status: Married    Spouse name: Not on file   Number of children: Not on file   Years of education: Not on file   Highest education level: Not on file  Occupational History   Not on file  Tobacco Use   Smoking status: Never   Smokeless tobacco: Never  Substance and Sexual Activity   Alcohol use: No   Drug use: No   Sexual activity: Not on file  Other Topics Concern   Not on file  Social History Narrative   Not on file   Social Determinants of Health   Financial Resource Strain: Not on file  Food Insecurity: Not on file  Transportation Needs: Not on file  Physical Activity: Not on file  Stress: Not on file  Social Connections: Not on file  Intimate Partner Violence:  Not on file     FAMILY HISTORY:  Family History  Problem Relation Age of Onset   Bone cancer Mother    Diabetes Father       REVIEW OF SYSTEMS:  Review of Systems  Constitutional:  Negative for chills and fever.  HENT:  Negative for congestion and sore throat.   Respiratory:  Negative for cough and shortness of breath.   Cardiovascular:  Negative for chest pain and palpitations.  Gastrointestinal:  Positive for abdominal pain, nausea and vomiting. Negative for constipation and diarrhea.  Genitourinary:  Negative for  dysuria and urgency.  All other systems reviewed and are negative.   VITAL SIGNS:  Temp:  [98 F (36.7 C)-101.6 F (38.7 C)] 98 F (36.7 C) (10/18 0456) Pulse Rate:  [80-120] 80 (10/18 0700) Resp:  [13-29] 17 (10/18 0700) BP: (91-141)/(58-97) 94/61 (10/18 0700) SpO2:  [93 %-99 %] 98 % (10/18 0700) Weight:  [72.6 kg] 72.6 kg (10/17 0958)     Height: 5\' 8"  (172.7 cm) Weight: 72.6 kg BMI (Calculated): 24.33   INTAKE/OUTPUT:  10/17 0701 - 10/18 0700 In: 86.4 [IV Piggyback:86.4] Out: -   PHYSICAL EXAM:  Physical Exam Vitals and nursing note reviewed. Exam conducted with a chaperone present.  Constitutional:      General: He is not in acute distress.    Appearance: Normal appearance. He is normal weight. He is not ill-appearing.     Comments: Patient resting in bed, NAD, family at bedside   HENT:     Head: Normocephalic and atraumatic.  Eyes:     General: No scleral icterus.    Conjunctiva/sclera: Conjunctivae normal.  Cardiovascular:     Rate and Rhythm: Normal rate.     Pulses: Normal pulses.     Heart sounds: No murmur heard. Pulmonary:     Effort: Pulmonary effort is normal. No respiratory distress.  Abdominal:     General: Abdomen is flat. There is no distension.     Palpations: Abdomen is soft.     Tenderness: There is abdominal tenderness in the right upper quadrant and epigastric area. There is no guarding or rebound.     Comments: Abdomen is soft, he is certainly tender in the epigastrium and RUQ, non-distended, no rebound/guarding   Genitourinary:    Comments: Deferred Musculoskeletal:     Right lower leg: No edema.     Left lower leg: No edema.  Skin:    General: Skin is warm and dry.     Coloration: Skin is not jaundiced.     Findings: No erythema.  Neurological:     General: No focal deficit present.     Mental Status: He is alert and oriented to person, place, and time.  Psychiatric:        Mood and Affect: Mood normal.        Behavior: Behavior  normal.      Labs:     Latest Ref Rng & Units 03/29/2022    5:15 AM 03/28/2022   10:04 AM  CBC  WBC 4.0 - 10.5 K/uL 14.6  19.7   Hemoglobin 13.0 - 17.0 g/dL 12.7  15.5   Hematocrit 39.0 - 52.0 % 37.8  45.4   Platelets 150 - 400 K/uL 136  220       Latest Ref Rng & Units 03/29/2022    5:15 AM 03/28/2022   10:04 AM 11/26/2020    9:24 AM  CMP  Glucose 70 - 99 mg/dL 126  188  BUN 8 - 23 mg/dL 28  23    Creatinine 1.77 - 1.24 mg/dL 1.16  5.79  0.38   Sodium 135 - 145 mmol/L 140  137    Potassium 3.5 - 5.1 mmol/L 4.9  4.0    Chloride 98 - 111 mmol/L 110  104    CO2 22 - 32 mmol/L 26  19    Calcium 8.9 - 10.3 mg/dL 8.6  9.7    Total Protein 6.5 - 8.1 g/dL  8.0    Total Bilirubin 0.3 - 1.2 mg/dL  1.7    Alkaline Phos 38 - 126 U/L  74    AST 15 - 41 U/L  32    ALT 0 - 44 U/L  20       Imaging studies:   CT Abdomen/Pelvis (03/28/2022) personally reviewed which shows distended gallbladder, pneumobilia, and significant pericholecystic inflammation, and radiologist report reviewed below:  IMPRESSION: 1. Distended gallbladder with moderate air and considerable pericholecystic inflammatory change. No calcified stones. Wall thickening and inflammation of adjacent hepatic flexure with diverticula present. Diagnostic considerations include emphysematous cholecystitis with secondary reactive wall thickening of the adjacent colon (favored diagnosis) versus right-sided diverticulitis with cholecystocolonic fistula (blurred fat plane between the gallbladder wall and adjacent inflamed colon.) Surgical consultation is recommended. No signs of free air to suggest perforation. 2. Small free fluid in the pelvis and upper abdomen.   Assessment/Plan: (ICD-10's: K81.0) 86 y.o. male with abdominal pain and leukocytosis found to have likely cholecystitis with pneumobilia and significant RUQ inflammation, complicated by CAD on full dose ASA   - Appreciate medicine admission  - Given  significant inflammation surrounding gallbladder concomitant with comorbid diease and need for full dose ASA, he is a sub-optimal candidate for surgical interventions in this setting. Case reviewed and discussed with interventional radiologist who agree with percutaneous cholecystostomy tube placement.   - NPO for IR procedure. If he dis doing well after this, okay for CLD - Continue IV Abx (Zosyn)   - Monitor abdominal examination - Pain control prn; antiemetics prn - Monitor leukocytosis - Okay to mobilize as tolerated   - Further management per primary service  All of the above findings and recommendations were discussed with the patient and her family, and all of their family's questions were answered to their expressed satisfaction.  Thank you for the opportunity to participate in this patient's care.   -- Lynden Oxford, PA-C [facility] 03/29/2022, 7:31 AM M-F: 7am - 4pm

## 2022-03-29 NOTE — Progress Notes (Signed)
[facility] NAME: Joel Spencer    MR#:  [id number]  DATE OF BIRTH:  [birth year]  SUBJECTIVE:  patient's wife and son at bedside. Just returned that earlier after getting gallbladder drain placed by interventional radiology. Patient complains of some local psych pain. No fever. Tolerating clear liquids. Seen by Dr. Tawnya Crook:  Blood pressure 110/64, pulse 87, temperature 98.2 F (36.8 C), resp. rate (!) 21, height 5\' 8"  (1.727 m), weight 72.6 kg, SpO2 95 %.  PHYSICAL EXAMINATION:   GENERAL:  86 y.o.-year-old patient lying in the bed with no acute distress.  LUNGS: Normal breath sounds bilaterally, no wheezing CARDIOVASCULAR: S1, S2 normal. No murmurs,   ABDOMEN: Soft, nontender, nondistended. Bowel sounds present. GB drain + EXTREMITIES: No  edema b/l.    NEUROLOGIC: nonfocal  patient is alert and awake SKIN: No obvious rash, lesion, or ulcer.   LABORATORY PANEL:  CBC Recent Labs  Lab 03/29/22 0515  WBC 14.6*  HGB 12.7*  HCT 37.8*  PLT 136*    Chemistries  Recent Labs  Lab 03/28/22 1004 03/29/22 0515  NA 137 140  K 4.0 4.9  CL 104 110  CO2 19* 26  GLUCOSE 188* 126*  BUN 23 28*  CREATININE 1.10 1.18  CALCIUM 9.7 8.6*  AST 32  --   ALT 20  --   ALKPHOS 74  --   BILITOT 1.7*  --    Cardiac Enzymes No results for input(s): "TROPONINI" in the last 168 hours. RADIOLOGY:  IR Perc Cholecystostomy  Result Date: 03/29/2022 CLINICAL DATA:  Emphysematous cholecystitis and need for percutaneous cholecystostomy due to poor candidacy for cholecystectomy at this time. EXAM: PERCUTANEOUS CHOLECYSTOSTOMY TUBE PLACEMENT COMPARISON:  CT of the abdomen on 03/28/2022 ANESTHESIA/SEDATION: Moderate (conscious) sedation was employed during this procedure. A total of Versed 1.0 mg and Fentanyl 50 mcg was administered intravenously. Moderate Sedation Time: 12 minutes. The patient's level of consciousness and vital signs  were monitored continuously by radiology nursing throughout the procedure under my direct supervision. CONTRAST:  41mL OMNIPAQUE IOHEXOL 300 MG/ML  SOLN MEDICATIONS: None FLUOROSCOPY TIME:  18 seconds.  4.5 mGy. PROCEDURE: The procedure, risks, benefits, and alternatives were explained to the patient and his wife. Questions regarding the procedure were encouraged and answered. The patient and his wife understand and consent to the procedure. The patient's wife signed for the patient. The right abdominal wall was prepped with chlorhexidine in a sterile fashion, and a sterile drape was applied covering the operative field. A sterile gown and sterile gloves were used for the procedure. Local anesthesia was provided with 1% Lidocaine. Ultrasound image documentation was performed. Fluoroscopy during the procedure and fluoro spot radiograph confirms appropriate catheter position. Ultrasound was utilized to localize the gallbladder. Under direct ultrasound guidance, an 18 gauge trocar needle was advanced into the gallbladder lumen. Aspiration was performed and a bile sample sent for culture studies. A guidewire was then advanced into the gallbladder. Percutaneous tract dilatation was then performed over a guidewire to 10-French. A 10-French pigtail drainage catheter was then advanced into the gallbladder lumen under fluoroscopy. Catheter was formed and injected with contrast material to confirm position. The catheter was flushed and connected to a gravity drainage bag. It was secured at the skin with a Prolene retention suture and adhesive retention device. COMPLICATIONS: None FINDINGS: After needle puncture of the gallbladder, a sample of dark bile was aspirated and sent for culture. The cholecystostomy  tube was advanced into the gallbladder lumen and formed. It is now draining bile. This tube will be left to gravity drainage. IMPRESSION: Percutaneous cholecystostomy with placement of 10-French drainage catheter into the  gallbladder lumen. This was left to gravity drainage. Electronically Signed   By: Aletta Edouard M.D.   On: 03/29/2022 12:39   CT Abdomen Pelvis W Contrast  Result Date: 03/28/2022 CLINICAL DATA:  Acute right flank pain EXAM: CT ABDOMEN AND PELVIS WITH CONTRAST TECHNIQUE: Multidetector CT imaging of the abdomen and pelvis was performed using the standard protocol following bolus administration of intravenous contrast. RADIATION DOSE REDUCTION: This exam was performed according to the departmental dose-optimization program which includes automated exposure control, adjustment of the mA and/or kV according to patient size and/or use of iterative reconstruction technique. CONTRAST:  150mL OMNIPAQUE IOHEXOL 300 MG/ML  SOLN COMPARISON:  CT 11/26/2020, bone scan 12/27/2020 FINDINGS: Lower chest: Lung bases demonstrate no acute airspace disease. Mild dependent atelectasis. Ectatic ascending aortic diameter up to 3.9 cm. Partially visualized cardiac pacing leads. Hepatobiliary: Circumscribed hypodense liver lesions without significant change. Posterior right hepatic lobe lesion is consistent with cyst containing thin septation. Left hepatic lobe lesion could reflect complicated cyst versus small hemangioma. Distended gallbladder with moderate air in the lumen. Considerable inflammation in the right upper quadrant. No biliary dilatation Pancreas: Unremarkable. No pancreatic ductal dilatation or surrounding inflammatory changes. Spleen: Normal in size without focal abnormality. Adrenals/Urinary Tract: Adrenal glands are normal. Bilateral parapelvic cysts, no imaging follow-up is recommended. No hydronephrosis. The bladder is unremarkable Stomach/Bowel: The stomach is nonenlarged. No dilated small bowel. Retrocecal appendix which is normal in caliber. Diffuse diverticular disease of the colon. Focal wall thickening of the hepatic flexure with indistinct plane between the inflamed colon and gallbladder, coronal series 5,  image 31. Vascular/Lymphatic: Nonaneurysmal aorta with atherosclerosis. No suspicious lymph nodes Reproductive: Prostate appears slightly enlarged Other: No free air.  Small free fluid in the pelvis. Musculoskeletal: No acute osseous abnormality. Similar small sclerotic foci within the L3 and L4 vertebra. IMPRESSION: 1. Distended gallbladder with moderate air and considerable pericholecystic inflammatory change. No calcified stones. Wall thickening and inflammation of adjacent hepatic flexure with diverticula present. Diagnostic considerations include emphysematous cholecystitis with secondary reactive wall thickening of the adjacent colon (favored diagnosis) versus right-sided diverticulitis with cholecystocolonic fistula (blurred fat plane between the gallbladder wall and adjacent inflamed colon.) Surgical consultation is recommended. No signs of free air to suggest perforation. 2. Small free fluid in the pelvis and upper abdomen. Critical Value/emergent results were called by telephone at the time of interpretation on 03/28/2022 at 4:51 pm to provider [facility] , who verbally acknowledged these results. Electronically Signed   By: Donavan Foil M.D.   On: 03/28/2022 16:52    Assessment and Plan  SOURISH ALLENDER is a 86 y.o. male with medical history significant of hypertension, hyperlipidemia, CAD, stable angina, sCHF with EF of 35%, hypothyroidism, medication noncompliance, who presents with abdominal pain, nausea, vomiting.   Patient states that his abdominal pain has been going on for more than 2 days, which has been progressively worsening.  The abdominal pain is located in right upper quadrant, sharp, 10 out of 10 in severity, nonradiating, aggravated by deep breath and movement.   CT abdomen/pelvis: 1. Distended gallbladder with moderate air and considerable pericholecystic inflammatory change. No calcified stones. Wall thickening and inflammation of adjacent hepatic flexure with diverticula  present. Diagnostic considerations include emphysematous cholecystitis with secondary reactive wall thickening of the adjacent colon (  favored diagnosis) versus right-sided diverticulitis with cholecystocolonic fistula (blurred fat plane between the gallbladder wall and adjacent inflamed colon.) Surgical consultation is recommended. No signs of free air to suggest perforation. 2. Small free fluid in the pelvis and upper abdomen.   Acute cholecystitis Severe sepsis due to acute cholecystitis: Patient meets criteria for severe sepsis with WBC 19.7, heart rate 120, RR 29, lactic acid 4.3.  CT scan findings are consistent with cholecystitis.  There is concern for a possible hepatic flexure perforated diverticulitis with fistula to the gallbladder, but per Dr. Hampton Abbot, this is less likely given short/acute presentation.  --s/p  percutaneous cholecystostomy drain placement on 10/18  --IV Zosyn (patient received 1 dose of cefepime and vancomycin in ED) -Blood culture negative so far -Trend lactic acid level 3.2-- 2.3 -Check procalcitonin level-- 1.73 -Pain control: As needed morphine and Percocet, Tylenol -As needed Zofran -received IV fluids per sepsis protocol -- white count trending down     Chronic systolic CHF (congestive heart failure) ([place]) 2D echo on 09/09/2019 showed EF of 35%.  Patient does not have leg edema or JVD.  No shortness of breath.  Clinically no signs of CHF exacerbation -Watch volume status closely while patient is on IV fluid   Hypertension Patient is not taking medications currently.  Blood pressure 141/92.  Patient is at high risk of developing hypotension due to severe sepsis. -IV hydralazine as needed   Stable angina History of CAD and stable angina: Currently no chest pain.  Patient is not taking any medications currently   Hyperlipemia Not taking medications currently -follow-up with PCP   Hypothyroidism Not taking medications currently -Follow-up with PCP      DVT ppx: SCD/start lovenox   Code Status: Full code   Family Communication:  Yes, patient's son and wife  at bed side.        Procedures: GB drain placement  Level of care: Med-Surg Status is: Inpatient Remains inpatient appropriate because: acute cholecystitis and severe sepsis.    TOTAL TIME TAKING CARE OF THIS PATIENT: 35 minutes.  >50% time spent on counselling and coordination of care  Note: This dictation was prepared with Dragon dictation along with smaller phrase technology. Any transcriptional errors that result from this process are unintentional.  Fritzi Mandes M.D    [facility]   CC: Primary care physician; Tracie Harrier, MD

## 2022-03-29 NOTE — Progress Notes (Signed)
Patient clinically stable post 10 FR Chole tube placement right abdomen per DR Kathlene Cote, tolerated well. Vitals stable pre and post procedure. Received Versed 1 mg along with Fentanyl 50 mcg IV for procedure. Report given to [facility] post procedure/recovery/ER 43.

## 2022-03-30 ENCOUNTER — Encounter: Payer: Self-pay | Admitting: Internal Medicine

## 2022-03-30 DIAGNOSIS — K810 Acute cholecystitis: Secondary | ICD-10-CM | POA: Diagnosis not present

## 2022-03-30 LAB — BASIC METABOLIC PANEL
Anion gap: 7 (ref 5–15)
BUN: 23 mg/dL (ref 8–23)
CO2: 22 mmol/L (ref 22–32)
Calcium: 7.8 mg/dL — ABNORMAL LOW (ref 8.9–10.3)
Chloride: 108 mmol/L (ref 98–111)
Creatinine, Ser: 0.94 mg/dL (ref 0.61–1.24)
GFR, Estimated: >60 mL/min (ref >60)
Glucose, Bld: 89 mg/dL (ref 70–99)
Potassium: 3.8 mmol/L (ref 3.5–5.1)
Sodium: 137 mmol/L (ref 135–145)

## 2022-03-30 LAB — GLUCOSE, CAPILLARY
Glucose-Capillary: 100 mg/dL — ABNORMAL HIGH (ref 70–99)
Glucose-Capillary: 89 mg/dL (ref 70–99)
Glucose-Capillary: 96 mg/dL (ref 70–99)

## 2022-03-30 LAB — CBC
HCT: 34 % — ABNORMAL LOW (ref 39.0–52.0)
Hemoglobin: 11.2 g/dL — ABNORMAL LOW (ref 13.0–17.0)
MCH: 29.3 pg (ref 26.0–34.0)
MCHC: 32.9 g/dL (ref 30.0–36.0)
MCV: 89 fL (ref 80.0–100.0)
Platelets: 117 10*3/uL — ABNORMAL LOW (ref 150–400)
RBC: 3.82 MIL/uL — ABNORMAL LOW (ref 4.22–5.81)
RDW: 13.6 % (ref 11.5–15.5)
WBC: 8.2 10*3/uL (ref 4.0–10.5)
nRBC: 0 % (ref 0.0–0.2)

## 2022-03-30 NOTE — Progress Notes (Signed)
[facility] SURGICAL PROGRESS NOTE (cpt [phone])  Hospital Day(s): 2.   Interval History: Patient seen and examined. Did have fever to 102.18F overnight; may be secondary to drain placement. Patient reports RUQ pain is improving. He denies chills, cough, CP, SOB, nausea, emesis. Leukocytosis now resolved; 8.2K. Renal function remains normal; sCr - 0.94; UO - unmeasured. Underwent percutaneous cholecystostomy tube placement yesterday (10/18); output 255 ccs. Cx from this growing GPR. He is on Zosyn. Tolerated CLD.   Review of Systems:  Constitutional: + Fever, (overnight, now resolved), denied chills  HEENT: denies cough or congestion  Respiratory: denies any shortness of breath  Cardiovascular: denies chest pain or palpitations  Gastrointestinal: + Abdominal pain, denies N/V Genitourinary: denies burning with urination or urinary frequency Musculoskeletal: denies pain, decreased motor or sensation Neurological: denies HA or vision/hearing changes   Vital signs in last 24 hours: [min-max] current  Temp:  [97.7 F (36.5 C)-102.6 F (39.2 C)] 98.7 F (37.1 C) (10/19 0806) Pulse Rate:  [68-96] 72 (10/19 0806) Resp:  [16-34] 16 (10/19 0806) BP: (94-132)/(56-84) 110/72 (10/19 0806) SpO2:  [91 %-99 %] 99 % (10/19 0806)     Height: 5\' 8"  (172.7 cm) Weight: 72.6 kg BMI (Calculated): 24.33   Intake/Output last 2 shifts:  10/18 0701 - 10/19 0700 In: 20 [I.V.:10] Out: 255 [Drains:255]   Physical Exam:  Constitutional: alert, cooperative and no distress  HENT: normocephalic without obvious abnormality  Eyes: PERRL, EOM's grossly intact and symmetric  Respiratory: breathing non-labored at rest  Cardiovascular: regular rate and sinus rhythm  Gastrointestinal: soft, RUQ soreness at drain site, and non-distended. No rebound/guarding. Percutaneous cholecystostomy tube in RUQ; bilious  Musculoskeletal:no edema or wounds, motor and sensation grossly intact, NT    Labs:      Latest Ref Rng & Units 03/30/2022    5:26 AM 03/29/2022    5:15 AM 03/28/2022   10:04 AM  CBC  WBC 4.0 - 10.5 K/uL 8.2  14.6  19.7   Hemoglobin 13.0 - 17.0 g/dL 03/30/2022  01.6  01.0   Hematocrit 39.0 - 52.0 % 34.0  37.8  45.4   Platelets 150 - 400 K/uL 117  136  220       Latest Ref Rng & Units 03/30/2022    5:26 AM 03/29/2022    5:15 AM 03/28/2022   10:04 AM  CMP  Glucose 70 - 99 mg/dL 89  03/30/2022  355   BUN 8 - 23 mg/dL 23  28  23    Creatinine 0.61 - 1.24 mg/dL 732   2.02   Sodium 135 - 145 mmol/L 137  140  137   Potassium 3.5 - 5.1 mmol/L 3.8  4.9  4.0   Chloride 98 - 111 mmol/L 108  110  104   CO2 22 - 32 mmol/L 22  26  19    Calcium 8.9 - 10.3 mg/dL 7.8  8.6  9.7   Total Protein 6.5 - 8.1 g/dL   8.0   Total Bilirubin 0.3 - 1.2 mg/dL   1.7   Alkaline Phos 38 - 126 U/L   74   AST 15 - 41 U/L   32   ALT 0 - 44 U/L   20      Imaging studies: No new pertinent imaging studies   Assessment/Plan: (ICD-10's: K81.0) 86 y.o. male with acute cholecystitis with pneumobilia and significant RUQ inflammation s/p percutaneous cholecystostomy tube placement on 10/18, complicated by CAD on full dose ASA    -  FLD this morning; advance as tolerated   - Continue IV Abx (Zosyn); follow up Cx; growing GPR   - Continue percutaneous cholecystostomy tube; monitor and record output daily. Flush per IR orders. He will need to flush daily at home with 5 ccs NS   - No emergent surgical interventions; Will plan for interval cholecystectomy in ~2-3 months once recovered from this insult   - Monitor abdominal examination   - Pain control prn; antiemetics prn - Monitor leukocytosis; resolved - Okay to mobilize as tolerated              - Further management per primary service  All of the above findings and recommendations were discussed with the patient, patient's family, and the medical team, and all of patient's and family's questions were answered to their expressed satisfaction.  -- [name], PA-C [facility] 03/30/2022, 8:30 AM M-F: 7am - 4pm

## 2022-03-30 NOTE — TOC Initial Note (Addendum)
Transition of Care [facility]) - Initial/Assessment Note    Patient Details  Name: Joel Spencer MRN: [id number] Date of Birth: 1935-07-21  Transition of Care [facility]) CM/SW Contact:    Colen Darling, [place] Phone Number: 03/30/2022, 12:56 PM  Clinical Narrative:                    SW met with the patient at bedside. He sees PCP Dr. Ginette Pitman. He has not had home health before. No DME. He drives to appointments. [facility] [place].     Patient Goals and CMS Choice Patient states their goals for this hospitalization and ongoing recovery are:: to improve      Expected Discharge Plan and Services        TBD                                        Prior Living Arrangements/Services     Patient language and need for interpreter reviewed:: No Do you feel safe going back to the place where you live?: Yes               Activities of Daily Living Home Assistive Devices/Equipment: Eyeglasses ADL Screening (condition at time of admission) Patient's cognitive ability adequate to safely complete daily activities?: Yes Is the patient deaf or have difficulty hearing?: No Does the patient have difficulty seeing, even when wearing glasses/contacts?: No Does the patient have difficulty concentrating, remembering, or making decisions?: No Patient able to express need for assistance with ADLs?: Yes Does the patient have difficulty dressing or bathing?: No Independently performs ADLs?: Yes (appropriate for developmental age) Does the patient have difficulty walking or climbing stairs?: No Weakness of Legs: None Weakness of Arms/Hands: None  Permission Sought/Granted Permission sought to share information with : Case Manager, Family Supports Permission granted to share information with : Yes, Verbal Permission Granted  Share Information with NAME: Ricki Rodriguez           Emotional Assessment Appearance:: Appears stated age Attitude/Demeanor/Rapport: Engaged,  Gracious Affect (typically observed): Accepting Orientation: : Oriented to Place, Oriented to  Time, Oriented to Self, Oriented to Situation      Admission diagnosis:  Acute cholecystitis [K81.0] Cholecystitis [K81.9] Cholecystitis, acute [K81.0] Patient Active Problem List   Diagnosis Date Noted   Cholecystitis, acute 03/29/2022   Acute cholecystitis 22/44/9753   Chronic systolic CHF (congestive heart failure) ([place]) 03/28/2022   Hyperlipemia    Hypertension    Hypothyroidism    Severe sepsis (HCC)    Stable angina 03/30/2016   PCP:  Tracie Harrier, MD Pharmacy:   [id number] [place] [place] [address] [id number]: [phone]: [phone]    Social Determinants of Health (SDOH) Interventions    Readmission Risk Interventions     No data to display

## 2022-03-30 NOTE — Plan of Care (Signed)

## 2022-03-30 NOTE — Progress Notes (Signed)
Referring Physician(s): Dr Aleen Campi  Supervising Physician: Pernell Dupre  Patient Status:  [name] - In-pt  Chief Complaint:  S/p cholecystostomy drain placement 10/18 with Dr Fredia Sorrow  Subjective:  Patient alert and upright in bed at time of exam. Patient accompanied by multiple family members at time of exam. Patient denies any pain or discomfort at this time. Patient and family have no concerns at this time.  Allergies: Patient has no known allergies.  Medications: Prior to Admission medications   Medication Sig Start Date End Date Taking? Authorizing Provider  acetaminophen (TYLENOL) 500 MG tablet Take 500-1,000 mg by mouth every 6 (six) hours as needed for mild pain or moderate pain.   Yes [provider]     Vital Signs: BP 121/73 (BP Location: Left Arm)   Pulse 83   Temp 99.3 F (37.4 C)   Resp 16   Ht 5\' 8"  (1.727 m)   Wt 160 lb (72.6 kg)   SpO2 96%   BMI 24.33 kg/m   Physical Exam Constitutional:      General: He is not in acute distress. Pulmonary:     Effort: Pulmonary effort is normal.  Abdominal:     Tenderness: There is no abdominal tenderness.     Comments: RUQ cholecystostomy drain in place. Suture and stat lock in place. Skin at insertion site is clean with no erythema, induration, bleeding, or drainage noted. Dressing is clean, dry, and intact. ~100 cc of bilious output in gravity bag at time of exam.  Skin:    General: Skin is warm and dry.  Neurological:     Mental Status: He is alert and oriented to person, place, and time.  Psychiatric:        Mood and Affect: Mood normal.        Behavior: Behavior normal.    Drain Location: RUQ Size: Fr size: 10 Fr Date of placement: 03/29/22  Currently to: Drain collection device: gravity 24 hour output:  Output by Drain (mL) 03/28/22 0701 - 03/28/22 1900 03/28/22 1901 - 03/29/22 0700 03/29/22 0701 - 03/29/22 1900 03/29/22 1901 - 03/30/22 0700 03/30/22 0701 - 03/30/22 1624  Biliary  Tube Cook slip-coat 10.2 Fr. RUQ    255     Interval imaging/drain manipulation:  None  Current examination: Flushes/aspirates easily.  Insertion site unremarkable. Suture and stat lock in place. Dressed appropriately.    Imaging: IR Perc Cholecystostomy  Result Date: 03/29/2022 CLINICAL DATA:  Emphysematous cholecystitis and need for percutaneous cholecystostomy due to poor candidacy for cholecystectomy at this time. EXAM: PERCUTANEOUS CHOLECYSTOSTOMY TUBE PLACEMENT COMPARISON:  CT of the abdomen on 03/28/2022 ANESTHESIA/SEDATION: Moderate (conscious) sedation was employed during this procedure. A total of Versed 1.0 mg and Fentanyl 50 mcg was administered intravenously. Moderate Sedation Time: 12 minutes. The patient's level of consciousness and vital signs were monitored continuously by radiology nursing throughout the procedure under my direct supervision. CONTRAST:  45mL OMNIPAQUE IOHEXOL 300 MG/ML  SOLN MEDICATIONS: None FLUOROSCOPY TIME:  18 seconds.  4.5 mGy. PROCEDURE: The procedure, risks, benefits, and alternatives were explained to the patient and his wife. Questions regarding the procedure were encouraged and answered. The patient and his wife understand and consent to the procedure. The patient's wife signed for the patient. The right abdominal wall was prepped with chlorhexidine in a sterile fashion, and a sterile drape was applied covering the operative field. A sterile gown and sterile gloves were used for the procedure. Local anesthesia was provided with 1% Lidocaine.  Ultrasound image documentation was performed. Fluoroscopy during the procedure and fluoro spot radiograph confirms appropriate catheter position. Ultrasound was utilized to localize the gallbladder. Under direct ultrasound guidance, an 18 gauge trocar needle was advanced into the gallbladder lumen. Aspiration was performed and a bile sample sent for culture studies. A guidewire was then advanced into the gallbladder.  Percutaneous tract dilatation was then performed over a guidewire to 10-French. A 10-French pigtail drainage catheter was then advanced into the gallbladder lumen under fluoroscopy. Catheter was formed and injected with contrast material to confirm position. The catheter was flushed and connected to a gravity drainage bag. It was secured at the skin with a Prolene retention suture and adhesive retention device. COMPLICATIONS: None FINDINGS: After needle puncture of the gallbladder, a sample of dark bile was aspirated and sent for culture. The cholecystostomy tube was advanced into the gallbladder lumen and formed. It is now draining bile. This tube will be left to gravity drainage. IMPRESSION: Percutaneous cholecystostomy with placement of 10-French drainage catheter into the gallbladder lumen. This was left to gravity drainage. Electronically Signed   By: Irish Lack M.D.   On: 03/29/2022 12:39   CT Abdomen Pelvis W Contrast  Result Date: 03/28/2022 CLINICAL DATA:  Acute right flank pain EXAM: CT ABDOMEN AND PELVIS WITH CONTRAST TECHNIQUE: Multidetector CT imaging of the abdomen and pelvis was performed using the standard protocol following bolus administration of intravenous contrast. RADIATION DOSE REDUCTION: This exam was performed according to the departmental dose-optimization program which includes automated exposure control, adjustment of the mA and/or kV according to patient size and/or use of iterative reconstruction technique. CONTRAST:  OMNIPAQUE IOHEXOL 300 MG/ML  SOLN COMPARISON:  CT 11/26/2020, bone scan 12/27/2020 FINDINGS: Lower chest: Lung bases demonstrate no acute airspace disease. Mild dependent atelectasis. Ectatic ascending aortic diameter up to 3.9 cm. Partially visualized cardiac pacing leads. Hepatobiliary: Circumscribed hypodense liver lesions without significant change. Posterior right hepatic lobe lesion is consistent with cyst containing thin septation. Left hepatic lobe  lesion could reflect complicated cyst versus small hemangioma. Distended gallbladder with moderate air in the lumen. Considerable inflammation in the right upper quadrant. No biliary dilatation Pancreas: Unremarkable. No pancreatic ductal dilatation or surrounding inflammatory changes. Spleen: Normal in size without focal abnormality. Adrenals/Urinary Tract: Adrenal glands are normal. Bilateral parapelvic cysts, no imaging follow-up is recommended. No hydronephrosis. The bladder is unremarkable Stomach/Bowel: The stomach is nonenlarged. No dilated small bowel. Retrocecal appendix which is normal in caliber. Diffuse diverticular disease of the colon. Focal wall thickening of the hepatic flexure with indistinct plane between the inflamed colon and gallbladder, coronal series 5, image 31. Vascular/Lymphatic: Nonaneurysmal aorta with atherosclerosis. No suspicious lymph nodes Reproductive: Prostate appears slightly enlarged Other: No free air.  Small free fluid in the pelvis. Musculoskeletal: No acute osseous abnormality. Similar small sclerotic foci within the L3 and L4 vertebra. IMPRESSION: 1. Distended gallbladder with moderate air and considerable pericholecystic inflammatory change. No calcified stones. Wall thickening and inflammation of adjacent hepatic flexure with diverticula present. Diagnostic considerations include emphysematous cholecystitis with secondary reactive wall thickening of the adjacent colon (favored diagnosis) versus right-sided diverticulitis with cholecystocolonic fistula (blurred fat plane between the gallbladder wall and adjacent inflamed colon.) Surgical consultation is recommended. No signs of free air to suggest perforation. 2. Small free fluid in the pelvis and upper abdomen. Critical Value/emergent results were called by telephone at the time of interpretation on 03/28/2022 at 4:51 pm to provider [facility] , who verbally acknowledged these results. Electronically Signed  By: Donavan Foil M.D.   On: 03/28/2022 16:52    Labs:  CBC: Recent Labs    03/28/22 1004 03/29/22 0515 03/30/22 0526  WBC 19.7* 14.6* 8.2  HGB 15.5 12.7* 11.2*  HCT 45.4 37.8* 34.0*  PLT 220 136* 117*    COAGS: Recent Labs    03/28/22 1658  INR 1.5*  APTT 33    BMP: Recent Labs    03/28/22 1004 03/29/22 0515 03/30/22 0526  NA 137 140 137  K 4.0 4.9 3.8  CL 104 110 108  CO2 19* 26 22  GLUCOSE 188* 126* 89  BUN 23 28* 23  CALCIUM 9.7 8.6* 7.8*  CREATININE 1.10 1.18 0.94  GFRNONAA >60 >60 >60    LIVER FUNCTION TESTS: Recent Labs    03/28/22 1004  BILITOT 1.7*  AST 32  ALT 20  ALKPHOS 74  PROT 8.0  ALBUMIN 4.3    Assessment and Plan: Joel Spencer is an 86 yo male s/p percutaneous cholecystostomy drain placement 10/18. Patient is doing well at this time. Culture results are pending. WBC count has dropped to 8.2 from 14.6  Plan: Continue TID flushes with 5 cc NS. Record output Q shift. Dressing changes QD or PRN if soiled.  Call IR APP or on call IR MD if difficulty flushing or sudden change in drain output.  Repeat imaging/possible drain injection once output < 10 mL/QD (excluding flush material). Consideration for drain removal if output is < 10 mL/QD (excluding flush material), pending discussion with the providing surgical service.  Discharge planning: Please contact IR APP or on call IR MD prior to patient d/c to ensure appropriate follow up plans are in place. Typically patient will follow up with IR clinic 10-14 days post d/c for repeat imaging/possible drain injection. IR scheduler will contact patient with date/time of appointment. Patient will need to flush drain QD with 5 cc NS, record output QD, dressing changes every 2-3 days or earlier if soiled.   IR will continue to follow - please call with questions or concerns.  Electronically Signed: Lura Em, [place] 03/30/2022, 4:21 PM   I spent a total of 15 Minutes at the the patient's bedside AND  on the patient's hospital floor or unit, greater than 50% of which was counseling/coordinating care for s/p perc chole placement.

## 2022-03-30 NOTE — Progress Notes (Signed)
RIQ dressing changed as ordered.No drainage noticed around. No signs and symptoms of infections.

## 2022-03-30 NOTE — Progress Notes (Signed)
[facility] [place] NAME: Joel Spencer    MR#:  [id number]  DATE OF BIRTH:  07/10/1935  SUBJECTIVE:  patient's wife and son at bedside.  Tolerating PO to full liquid diet. Abdominal pain much improved. No fever. Denies shortness of breath.  VITALS:  Blood pressure 110/72, pulse 72, temperature 98.7 F (37.1 C), resp. rate 16, height 5\' 8"  (1.727 m), weight 72.6 kg, SpO2 99 %.  PHYSICAL EXAMINATION:   GENERAL:  86 y.o.-year-old patient lying in the bed with no acute distress.  LUNGS: Normal breath sounds bilaterally, no wheezing CARDIOVASCULAR: S1, S2 normal. No murmurs,   ABDOMEN: Soft, nontender, nondistended. Bowel sounds present. GB drain + EXTREMITIES: No  edema b/l.    NEUROLOGIC: nonfocal  patient is alert and awake SKIN: No obvious rash, lesion, or ulcer.   LABORATORY PANEL:  CBC Recent Labs  Lab 03/30/22 0526  WBC 8.2  HGB 11.2*  HCT 34.0*  PLT 117*     Chemistries  Recent Labs  Lab 03/28/22 1004 03/29/22 0515 03/30/22 0526  NA 137   < > 137  K 4.0   < > 3.8  CL 104   < > 108  CO2 19*   < > 22  GLUCOSE 188*   < > 89  BUN 23   < > 23  CREATININE 1.10   < > 0.94  CALCIUM 9.7   < > 7.8*  AST 32  --   --   ALT 20  --   --   ALKPHOS 74  --   --   BILITOT 1.7*  --   --    < > = values in this interval not displayed.    Cardiac Enzymes No results for input(s): "TROPONINI" in the last 168 hours. RADIOLOGY:  IR Perc Cholecystostomy  Result Date: 03/29/2022 CLINICAL DATA:  Emphysematous cholecystitis and need for percutaneous cholecystostomy due to poor candidacy for cholecystectomy at this time. EXAM: PERCUTANEOUS CHOLECYSTOSTOMY TUBE PLACEMENT COMPARISON:  CT of the abdomen on 03/28/2022 ANESTHESIA/SEDATION: Moderate (conscious) sedation was employed during this procedure. A total of Versed 1.0 mg and Fentanyl 50 mcg was administered intravenously. Moderate Sedation Time: 12 minutes. The patient's level of  consciousness and vital signs were monitored continuously by radiology nursing throughout the procedure under my direct supervision. CONTRAST:  90mL OMNIPAQUE IOHEXOL 300 MG/ML  SOLN MEDICATIONS: None FLUOROSCOPY TIME:  18 seconds.  4.5 mGy. PROCEDURE: The procedure, risks, benefits, and alternatives were explained to the patient and his wife. Questions regarding the procedure were encouraged and answered. The patient and his wife understand and consent to the procedure. The patient's wife signed for the patient. The right abdominal wall was prepped with chlorhexidine in a sterile fashion, and a sterile drape was applied covering the operative field. A sterile gown and sterile gloves were used for the procedure. Local anesthesia was provided with 1% Lidocaine. Ultrasound image documentation was performed. Fluoroscopy during the procedure and fluoro spot radiograph confirms appropriate catheter position. Ultrasound was utilized to localize the gallbladder. Under direct ultrasound guidance, an 18 gauge trocar needle was advanced into the gallbladder lumen. Aspiration was performed and a bile sample sent for culture studies. A guidewire was then advanced into the gallbladder. Percutaneous tract dilatation was then performed over a guidewire to 10-French. A 10-French pigtail drainage catheter was then advanced into the gallbladder lumen under fluoroscopy. Catheter was formed and injected with contrast material to confirm position. The catheter was  flushed and connected to a gravity drainage bag. It was secured at the skin with a Prolene retention suture and adhesive retention device. COMPLICATIONS: None FINDINGS: After needle puncture of the gallbladder, a sample of dark bile was aspirated and sent for culture. The cholecystostomy tube was advanced into the gallbladder lumen and formed. It is now draining bile. This tube will be left to gravity drainage. IMPRESSION: Percutaneous cholecystostomy with placement of  10-French drainage catheter into the gallbladder lumen. This was left to gravity drainage. Electronically Signed   By: Irish Lack M.D.   On: 03/29/2022 12:39   CT Abdomen Pelvis W Contrast  Result Date: 03/28/2022 CLINICAL DATA:  Acute right flank pain EXAM: CT ABDOMEN AND PELVIS WITH CONTRAST TECHNIQUE: Multidetector CT imaging of the abdomen and pelvis was performed using the standard protocol following bolus administration of intravenous contrast. RADIATION DOSE REDUCTION: This exam was performed according to the departmental dose-optimization program which includes automated exposure control, adjustment of the mA and/or kV according to patient size and/or use of iterative reconstruction technique. CONTRAST:  OMNIPAQUE IOHEXOL 300 MG/ML  SOLN COMPARISON:  CT 11/26/2020, bone scan 12/27/2020 FINDINGS: Lower chest: Lung bases demonstrate no acute airspace disease. Mild dependent atelectasis. Ectatic ascending aortic diameter up to 3.9 cm. Partially visualized cardiac pacing leads. Hepatobiliary: Circumscribed hypodense liver lesions without significant change. Posterior right hepatic lobe lesion is consistent with cyst containing thin septation. Left hepatic lobe lesion could reflect complicated cyst versus small hemangioma. Distended gallbladder with moderate air in the lumen. Considerable inflammation in the right upper quadrant. No biliary dilatation Pancreas: Unremarkable. No pancreatic ductal dilatation or surrounding inflammatory changes. Spleen: Normal in size without focal abnormality. Adrenals/Urinary Tract: Adrenal glands are normal. Bilateral parapelvic cysts, no imaging follow-up is recommended. No hydronephrosis. The bladder is unremarkable Stomach/Bowel: The stomach is nonenlarged. No dilated small bowel. Retrocecal appendix which is normal in caliber. Diffuse diverticular disease of the colon. Focal wall thickening of the hepatic flexure with indistinct plane between the inflamed  colon and gallbladder, coronal series 5, image 31. Vascular/Lymphatic: Nonaneurysmal aorta with atherosclerosis. No suspicious lymph nodes Reproductive: Prostate appears slightly enlarged Other: No free air.  Small free fluid in the pelvis. Musculoskeletal: No acute osseous abnormality. Similar small sclerotic foci within the L3 and L4 vertebra. IMPRESSION: 1. Distended gallbladder with moderate air and considerable pericholecystic inflammatory change. No calcified stones. Wall thickening and inflammation of adjacent hepatic flexure with diverticula present. Diagnostic considerations include emphysematous cholecystitis with secondary reactive wall thickening of the adjacent colon (favored diagnosis) versus right-sided diverticulitis with cholecystocolonic fistula (blurred fat plane between the gallbladder wall and adjacent inflamed colon.) Surgical consultation is recommended. No signs of free air to suggest perforation. 2. Small free fluid in the pelvis and upper abdomen. Critical Value/emergent results were called by telephone at the time of interpretation on 03/28/2022 at 4:51 pm to provider [facility] , who verbally acknowledged these results. Electronically Signed   By: Jasmine Pang M.D.   On: 03/28/2022 16:52    Assessment and Plan  CORNIE HERRINGTON is a 86 y.o. male with medical history significant of hypertension, hyperlipidemia, CAD, stable angina, sCHF with EF of 35%, hypothyroidism, medication noncompliance, who presents with abdominal pain, nausea, vomiting.   Patient states that his abdominal pain has been going on for more than 2 days, which has been progressively worsening.  The abdominal pain is located in right upper quadrant, sharp, 10 out of 10 in severity, nonradiating, aggravated by deep breath and  movement.   CT abdomen/pelvis: 1. Distended gallbladder with moderate air and considerable pericholecystic inflammatory change. No calcified stones. Wall thickening and inflammation of  adjacent hepatic flexure with diverticula present. Diagnostic considerations include emphysematous cholecystitis with secondary reactive wall thickening of the adjacent colon (favored diagnosis) versus right-sided diverticulitis with cholecystocolonic fistula (blurred fat plane between the gallbladder wall and adjacent inflamed colon.) Surgical consultation is recommended. No signs of free air to suggest perforation. 2. Small free fluid in the pelvis and upper abdomen.   Acute cholecystitis Severe sepsis due to acute cholecystitis: Patient meets criteria for severe sepsis with WBC 19.7, heart rate 120, RR 29, lactic acid 4.3.  CT scan findings are consistent with cholecystitis.  There is concern for a possible hepatic flexure perforated diverticulitis with fistula to the gallbladder, but per Dr. Aleen Campi, this is less likely given short/acute presentation.  --s/p  percutaneous cholecystostomy drain placement on 10/18  --IV Zosyn (patient received 1 dose of cefepime and vancomycin in ED) -Blood culture negative so far -Trend lactic acid level 3.2-- 2.3 -Check procalcitonin level-- 1.73 -Pain control: As needed morphine and Percocet, Tylenol -As needed Zofran -received IV fluids per sepsis protocol -- white count trending down to normal --afebrile today --GB fluid culture no growth few GPR    Chronic systolic CHF (congestive heart failure) (HCC) 2D echo on 09/09/2019 showed EF of 35%.  Patient does not have leg edema or JVD.  No shortness of breath.  Clinically no signs of CHF exacerbation --d/c IVF   Hypertension Patient is not taking medications currently.  Blood pressure 141/92.  Patient is at high risk of developing hypotension due to severe sepsis. -IV hydralazine as needed   Stable angina History of CAD and stable angina: Currently no chest pain.  Patient is not taking any medications currently   Hyperlipemia Not taking medications currently -follow-up with PCP    Hypothyroidism Not taking medications currently -Follow-up with PCP     DVT ppx: SCD/start lovenox   Code Status: Full code   Family Communication:  Yes, patient's son and wife  at bed side.    Consultant: Dr Nicole Cella surgery    Procedures: GB drain placement  Level of care: Telemetry Medical Status is: Inpatient Remains inpatient appropriate because: acute cholecystitis and severe sepsis.    TOTAL TIME TAKING CARE OF THIS PATIENT: 35 minutes.  >50% time spent on counselling and coordination of care  Note: This dictation was prepared with Dragon dictation along with smaller phrase technology. Any transcriptional errors that result from this process are unintentional.  Enedina Finner M.D    [facility]   CC: Primary care physician; Barbette Reichmann, MD

## 2022-03-31 DIAGNOSIS — K810 Acute cholecystitis: Secondary | ICD-10-CM | POA: Diagnosis not present

## 2022-03-31 LAB — GLUCOSE, CAPILLARY
Glucose-Capillary: 118 mg/dL — ABNORMAL HIGH (ref 70–99)
Glucose-Capillary: 95 mg/dL (ref 70–99)

## 2022-03-31 MED ORDER — SODIUM CHLORIDE 0.9% FLUSH: 5.0000 mL | Freq: Every day | INTRAVENOUS | 1 refills | Status: DC

## 2022-03-31 MED ORDER — AMOXICILLIN-POT CLAVULANATE 875-125 MG PO TABS: 1.0000 | ORAL_TABLET | Freq: Two times a day (BID) | ORAL | 0 refills | Status: DC

## 2022-03-31 MED ORDER — HYDROCODONE-ACETAMINOPHEN 5-325 MG PO TABS: 1.0000 | ORAL_TABLET | Freq: Four times a day (QID) | ORAL | 0 refills | Status: DC | PRN

## 2022-03-31 NOTE — Care Management Important Message (Signed)
Important Message  Patient Details  Name: Joel Spencer MRN: [id number] Date of Birth: 1935-11-18   Medicare Important Message Given:  N/A - LOS <3 / Initial given by admissions     [name] 03/31/2022, 7:47 AM

## 2022-03-31 NOTE — TOC Transition Note (Signed)
Transition of Care [facility]) - CM/SW Discharge Note   Patient Details  Name: Joel Spencer MRN: [id number] Date of Birth: 01-20-1936  Transition of Care [facility] CM/SW Contact:  Colen Darling, [facility] Phone Number: 03/31/2022, 8:27 AM   Clinical Narrative:       Final next level of care: Home/Self Care Barriers to Discharge: Barriers Resolved   Patient Goals and CMS Choice Patient states their goals for this hospitalization and ongoing recovery are:: to improve      Discharge Placement                    Patient and family notified of of transfer: 03/31/22  Discharge Plan and Services                 Patient to discharge home today.                    Social Determinants of Health (SDOH) Interventions     Readmission Risk Interventions     No data to display

## 2022-03-31 NOTE — Evaluation (Signed)
Physical Therapy Evaluation Patient Details Name: Joel Spencer MRN: [id number] DOB: Nov 17, 1935 Today's Date: 03/31/2022  History of Present Illness  Pt is an 86 y.o. male presenting to hospital 10/17 with c/o 3 days of abdominal pain RUQ, N/V; no bowel movement for about 1 week.  Pt admitted with acute cholecystitis and severe sepsis.  S/p percutaneous cholecystostomy 03/29/22.  PMH includes htn, HLD, CAD, stable angina, sCHF with EF of 35%, hypothyroidism.  Clinical Impression  Prior to hospital admission, pt was independent with functional mobility; lives with his wife on main level of home with 3 STE B railings.  Currently pt is modified independent semi-supine to sitting edge of bed; SBA with transfers; CGA progressing to SBA with ambulation 300 feet; and CGA navigating 3 steps with UE support on railing.  No loss of balance noted during sessions activities.  RUQ pain 3/10 at rest beginning of session and 0/10 at rest end of session.  Pt would benefit from skilled PT to address noted impairments and functional limitations during hospitalization (see below for any additional details).  Upon hospital discharge, no further PT needs anticipated.     Recommendations for follow up therapy are one component of a multi-disciplinary discharge planning process, led by the attending physician.  Recommendations may be updated based on patient status, additional functional criteria and insurance authorization.  Follow Up Recommendations No PT follow up      Assistance Recommended at Discharge PRN  Patient can return home with the following  Assistance with cooking/housework;Assist for transportation;Help with stairs or ramp for entrance    Equipment Recommendations None recommended by PT  Recommendations for Other Services       Functional Status Assessment Patient has had a recent decline in their functional status and demonstrates the ability to make significant improvements in function in a  reasonable and predictable amount of time.     Precautions / Restrictions Precautions Precautions: Fall Precaution Comments: RUQ biliary tube Restrictions Weight Bearing Restrictions: No      Mobility  Bed Mobility Overal bed mobility: Modified Independent             General bed mobility comments: Semi-supine to sitting without any noted difficulties (HOB elevated)    Transfers Overall transfer level: Needs assistance Equipment used: None Transfers: Sit to/from Stand, Bed to chair/wheelchair/BSC Sit to Stand: Supervision   Step pivot transfers: Supervision       General transfer comment: x2 trials standing from bed; steady transfers    Ambulation/Gait Ambulation/Gait assistance: Min guard, Supervision Gait Distance (Feet): 300 Feet Assistive device: None Gait Pattern/deviations: Step-through pattern       General Gait Details: steady ambulation  Stairs Stairs: Yes Stairs assistance: Min guard Stair Management: One rail Right, Step to pattern, Forwards Number of Stairs: 3 General stair comments: steady safe stairs navigation  Wheelchair Mobility    Modified Rankin (Stroke Patients Only)       Balance Overall balance assessment: Needs assistance Sitting-balance support: No upper extremity supported, Feet supported Sitting balance-Leahy Scale: Normal Sitting balance - Comments: steady sitting reaching outside BOS   Standing balance support: No upper extremity supported Standing balance-Leahy Scale: Good Standing balance comment: steady ambulating with head turns R/L                             Pertinent Vitals/Pain Pain Assessment Pain Assessment: 0-10 Pain Score: 0-No pain (3/10 at rest beginning of session; 0/10 at rest end  of session) Pain Location: R UQ drain/incision area Pain Descriptors / Indicators: Tender, Sore Pain Intervention(s): Limited activity within patient's tolerance, Monitored during session Vitals (HR and O2  on room air) stable and WFL throughout treatment session.    Home Living Family/patient expects to be discharged to:: Private residence Living Arrangements: Spouse/significant other Available Help at Discharge: Family Type of Home: House Home Access: Stairs to enter Entrance Stairs-Rails: Psychiatric nurse of Steps: [place]: Two level;Able to live on main level with bedroom/bathroom Home Equipment: Grab bars - tub/shower;Shower seat - built Medical sales representative (2 wheels)      Prior Function Prior Level of Function : Independent/Modified Independent             Mobility Comments: (+) driving; no falls reported in last 6 months       Hand Dominance        Extremity/Trunk Assessment   Upper Extremity Assessment Upper Extremity Assessment: Generalized weakness    Lower Extremity Assessment Lower Extremity Assessment: Generalized weakness    Cervical / Trunk Assessment Cervical / Trunk Assessment: Normal  Communication   Communication: HOH  Cognition Arousal/Alertness: Awake/alert Behavior During Therapy: WFL for tasks assessed/performed Overall Cognitive Status: Within Functional Limits for tasks assessed                                          General Comments General comments (skin integrity, edema, etc.): no drainage on dressing RUQ noted beginning/end of session.  Nursing cleared pt for participation in physical therapy.  Pt agreeable to PT session.    Exercises     Assessment/Plan    PT Assessment Patient needs continued PT services  PT Problem List Decreased strength;Decreased mobility;Decreased knowledge of precautions;Pain;Decreased skin integrity       PT Treatment Interventions DME instruction;Gait training;Stair training;Functional mobility training;Therapeutic activities;Therapeutic exercise;Balance training;Patient/family education    PT Goals (Current goals can be found in the Care Plan section)  Acute  Rehab PT Goals Patient Stated Goal: to go home today PT Goal Formulation: With patient Time For Goal Achievement: 04/14/22 Potential to Achieve Goals: Good    Frequency Min 2X/week     Co-evaluation               AM-PAC PT "6 Clicks" Mobility  Outcome Measure Help needed turning from your back to your side while in a flat bed without using bedrails?: None Help needed moving from lying on your back to sitting on the side of a flat bed without using bedrails?: None Help needed moving to and from a bed to a chair (including a wheelchair)?: A Little Help needed standing up from a chair using your arms (e.g., wheelchair or bedside chair)?: A Little Help needed to walk in hospital room?: A Little Help needed climbing 3-5 steps with a railing? : A Little 6 Click Score: 20    End of Session Equipment Utilized During Treatment: Gait belt (up high away from drain placement) Activity Tolerance: Patient tolerated treatment well Patient left: in chair;with call bell/phone within reach;with chair alarm set;with nursing/sitter in room Nurse Communication: Mobility status;Precautions PT Visit Diagnosis: Muscle weakness (generalized) (M62.81)    Time: 8841-6606 PT Time Calculation (min) (ACUTE ONLY): 29 min   Charges:   PT Evaluation $PT Eval Low Complexity: 1 Low          Avanni Turnbaugh, PT 03/31/22, 1:20  PM   

## 2022-03-31 NOTE — Progress Notes (Signed)
03/31/2022  Subjective: No acute events.  Patient's diet advanced to regular for dinner.  Denies any worsening pain or nausea.  Culture data no growth yet, showing GPR.   Vital signs: Temp:  [97.5 F (36.4 C)-99.3 F (37.4 C)] 97.5 F (36.4 C) (10/20 0752) Pulse Rate:  [73-84] 73 (10/20 0752) Resp:  [16-18] 18 (10/20 0752) BP: (121-139)/(73-77) 136/77 (10/20 0752) SpO2:  [91 %-96 %] 94 % (10/20 0752)   Intake/Output: 10/19 0701 - 10/20 0700 In: 908.4 [P.O.:720; IV Piggyback:178.4] Out: 2845 [Urine:2800; Drains:45] Last BM Date :  (LAST WEEK)  Physical Exam: Constitutional:  No acute distress Abdomen:  soft, non-distended, non-tender to palpation.  RUQ drain in place with bilious fluid.  Labs:  Recent Labs    03/29/22 0515 03/30/22 0526  WBC 14.6* 8.2  HGB 12.7* 11.2*  HCT 37.8* 34.0*  PLT 136* 117*   Recent Labs    03/29/22 0515 03/30/22 0526  NA 140 137  K 4.9 3.8  CL 110 108  CO2 26 22  GLUCOSE 126* 89  BUN 28* 23  CREATININE 1.18 0.94  CALCIUM 8.6* 7.8*   Recent Labs    03/28/22 1658  LABPROT 17.5*  INR 1.5*    Imaging: No results found.  Assessment/Plan: This is a 86 y.o. male with acute cholecystitis s/p perc chole drain.  --Patient is doing well and is recovering from his acute cholecystitis.  No worsening pain, tolerating diet, WBC normalized yesterday. --OK to d/c home today, with empiric Augmentin.  Will add rx for abx, pain meds, and NS flushes. --F/U 3 weeks.   I spent 35 minutes dedicated to the care of this patient on the date of this encounter to include pre-visit review of records, face-to-face time with the patient discussing diagnosis and management, and any post-visit coordination of care.  Melvyn Neth, [facility]

## 2022-03-31 NOTE — Discharge Summary (Signed)
Physician Discharge Summary   Joel Spencer  male DOB: 09-13-1935  DQQ:[id number]  PCP: Barbette Reichmann, MD  Admit date: 03/28/2022 Discharge date: 03/31/2022  Admitted From: home Disposition:  home CODE STATUS: Full code  Discharge Instructions     Discharge wound care:   Complete by: As directed    Perform saline flush daily for your chole tube as directed. [facility] Course:  For full details, please see H&P, progress notes, consult notes and ancillary notes.  Briefly,  Joel Spencer is a 86 y.o. male with medical history significant of hypertension, CAD, sCHF with EF of 35%, hypothyroidism, who presented with abdominal pain, nausea, vomiting and found to have acute cholecystitis.    Acute cholecystitis Severe sepsis due to acute cholecystitis: Patient meets criteria for severe sepsis with WBC 19.7, heart rate 120, RR 29, lactic acid 4.3.  CT scan findings are consistent with cholecystitis.  There is concern for a possible hepatic flexure perforated diverticulitis with fistula to the gallbladder, but per Dr. Aleen Campi, this is less likely given short/acute presentation.  --s/p percutaneous cholecystostomy drain placement on 10/18 --Blood culture negative so far --GB fluid culture no growth few GPR -- white count trending down to normal  --patient received 1 dose of cefepime and vancomycin in ED and continued on 2 days of zosyn, and discharged on 7 days of Agumentin, per GenSurg. --pt was instructed on how to do chole drain flush daily by nursing. --pt was cleared for discharge by GenSurg, and will follow up with Dr. Aleen Campi in 3 weeks.    Chronic systolic CHF (congestive heart failure) (HCC) 2D echo on 09/09/2019 showed EF of 35%.  Patient does not have leg edema or JVD.  No shortness of breath.  Clinically no signs of CHF exacerbation   Hx of Hypertension, not currently active Patient is not taking BP medications PTA.  BP wnl.   Hyperlipemia Not taking  medications currently   Hypothyroidism Not taking medications currently -Follow-up with PCP   Discharge Diagnoses:  Principal Problem:   Acute cholecystitis Active Problems:   Severe sepsis (HCC)   Chronic systolic CHF (congestive heart failure) (HCC)   Hypertension   Stable angina   Hyperlipemia   Hypothyroidism   Cholecystitis, acute   30 Day Unplanned Readmission Risk Score    Flowsheet Row ED to Hosp-Admission (Current) from 03/28/2022 in [facility] 1C MEDICAL TELEMETRY  30 Day Unplanned Readmission Risk Score (%) 12.32 Filed at 03/31/2022 0801       This score is the patient's risk of an unplanned readmission within 30 days of being discharged (0 -100%). The score is based on dignosis, age, lab data, medications, orders, and past utilization.   Low:  0-14.9   Medium: 15-21.9   High: 22-29.9   Extreme: 30 and above         Discharge Instructions:  Allergies as of 03/31/2022   No Known Allergies      Medication List     TAKE these medications    acetaminophen 500 MG tablet Commonly known as: TYLENOL Take 500-1,000 mg by mouth every 6 (six) hours as needed for mild pain or moderate pain.   amoxicillin-clavulanate 875-125 MG tablet Commonly known as: AUGMENTIN Take 1 tablet by mouth 2 (two) times daily.   HYDROcodone-acetaminophen 5-325 MG tablet Commonly known as: NORCO/VICODIN Take 1 tablet by mouth every 6 (six) hours as needed for moderate pain.   sodium chloride flush  0.9 % Soln Commonly known as: NS 5 mLs by Intracatheter route daily.               Discharge Care Instructions  (From admission, onward)           Start     Ordered   03/31/22 0000  Discharge wound care:       Comments: Perform saline flush daily for your chole tube as directed. - -   03/31/22 1245             Follow-up Information     Henrene Dodge, MD Follow up in 3 week(s).   Specialty: General Surgery Contact information: [address] [place] [address] [phone]         Barbette Reichmann, MD Follow up in 1 week(s).   Specialty: Internal Medicine Contact information: [address] [place] [address] [phone]                 No Known Allergies   The results of significant diagnostics from this hospitalization (including imaging, microbiology, ancillary and laboratory) are listed below for reference.   Consultations:   Procedures/Studies: IR Perc Cholecystostomy  Result Date: 03/29/2022 CLINICAL DATA:  Emphysematous cholecystitis and need for percutaneous cholecystostomy due to poor candidacy for cholecystectomy at this time. EXAM: PERCUTANEOUS CHOLECYSTOSTOMY TUBE PLACEMENT COMPARISON:  CT of the abdomen on 03/28/2022 ANESTHESIA/SEDATION: Moderate (conscious) sedation was employed during this procedure. A total of Versed 1.0 mg and Fentanyl 50 mcg was administered intravenously. Moderate Sedation Time: 12 minutes. The patient's level of consciousness and vital signs were monitored continuously by radiology nursing throughout the procedure under my direct supervision. CONTRAST:  63mL OMNIPAQUE IOHEXOL 300 MG/ML  SOLN MEDICATIONS: None FLUOROSCOPY TIME:  18 seconds.  4.5 mGy. PROCEDURE: The procedure, risks, benefits, and alternatives were explained to the patient and his wife. Questions regarding the procedure were encouraged and answered. The patient and his wife understand and consent to the procedure. The patient's wife signed for the patient. The right abdominal wall was prepped with chlorhexidine in a sterile fashion, and a sterile drape was applied covering the operative field. A sterile gown and sterile gloves were used for the procedure. Local anesthesia was provided with 1% Lidocaine. Ultrasound image documentation was performed. Fluoroscopy during the procedure and fluoro spot radiograph confirms appropriate catheter position.  Ultrasound was utilized to localize the gallbladder. Under direct ultrasound guidance, an 18 gauge trocar needle was advanced into the gallbladder lumen. Aspiration was performed and a bile sample sent for culture studies. A guidewire was then advanced into the gallbladder. Percutaneous tract dilatation was then performed over a guidewire to 10-French. A 10-French pigtail drainage catheter was then advanced into the gallbladder lumen under fluoroscopy. Catheter was formed and injected with contrast material to confirm position. The catheter was flushed and connected to a gravity drainage bag. It was secured at the skin with a Prolene retention suture and adhesive retention device. COMPLICATIONS: None FINDINGS: After needle puncture of the gallbladder, a sample of dark bile was aspirated and sent for culture. The cholecystostomy tube was advanced into the gallbladder lumen and formed. It is now draining bile. This tube will be left to gravity drainage. IMPRESSION: Percutaneous cholecystostomy with placement of 10-French drainage catheter into the gallbladder lumen. This was left to gravity drainage. Electronically Signed   By: Irish Lack M.D.   On: 03/29/2022 12:39   CT Abdomen Pelvis W Contrast  Result Date: 03/28/2022  CLINICAL DATA:  Acute right flank pain EXAM: CT ABDOMEN AND PELVIS WITH CONTRAST TECHNIQUE: Multidetector CT imaging of the abdomen and pelvis was performed using the standard protocol following bolus administration of intravenous contrast. RADIATION DOSE REDUCTION: This exam was performed according to the departmental dose-optimization program which includes automated exposure control, adjustment of the mA and/or kV according to patient size and/or use of iterative reconstruction technique. CONTRAST:  OMNIPAQUE IOHEXOL 300 MG/ML  SOLN COMPARISON:  CT 11/26/2020, bone scan 12/27/2020 FINDINGS: Lower chest: Lung bases demonstrate no acute airspace disease. Mild dependent atelectasis.  Ectatic ascending aortic diameter up to 3.9 cm. Partially visualized cardiac pacing leads. Hepatobiliary: Circumscribed hypodense liver lesions without significant change. Posterior right hepatic lobe lesion is consistent with cyst containing thin septation. Left hepatic lobe lesion could reflect complicated cyst versus small hemangioma. Distended gallbladder with moderate air in the lumen. Considerable inflammation in the right upper quadrant. No biliary dilatation Pancreas: Unremarkable. No pancreatic ductal dilatation or surrounding inflammatory changes. Spleen: Normal in size without focal abnormality. Adrenals/Urinary Tract: Adrenal glands are normal. Bilateral parapelvic cysts, no imaging follow-up is recommended. No hydronephrosis. The bladder is unremarkable Stomach/Bowel: The stomach is nonenlarged. No dilated small bowel. Retrocecal appendix which is normal in caliber. Diffuse diverticular disease of the colon. Focal wall thickening of the hepatic flexure with indistinct plane between the inflamed colon and gallbladder, coronal series 5, image 31. Vascular/Lymphatic: Nonaneurysmal aorta with atherosclerosis. No suspicious lymph nodes Reproductive: Prostate appears slightly enlarged Other: No free air.  Small free fluid in the pelvis. Musculoskeletal: No acute osseous abnormality. Similar small sclerotic foci within the L3 and L4 vertebra. IMPRESSION: 1. Distended gallbladder with moderate air and considerable pericholecystic inflammatory change. No calcified stones. Wall thickening and inflammation of adjacent hepatic flexure with diverticula present. Diagnostic considerations include emphysematous cholecystitis with secondary reactive wall thickening of the adjacent colon (favored diagnosis) versus right-sided diverticulitis with cholecystocolonic fistula (blurred fat plane between the gallbladder wall and adjacent inflamed colon.) Surgical consultation is recommended. No signs of free air to suggest  perforation. 2. Small free fluid in the pelvis and upper abdomen. Critical Value/emergent results were called by telephone at the time of interpretation on 03/28/2022 at 4:51 pm to provider [facility] , who verbally acknowledged these results. Electronically Signed   By: Jasmine Pang M.D.   On: 03/28/2022 16:52      Labs: BNP (last 3 results) Recent Labs    03/28/22 1004  BNP 493.8*   Basic Metabolic Panel: Recent Labs  Lab 03/28/22 1004 03/29/22 0515 03/30/22 0526  NA 137 140 137  K 4.0 4.9 3.8  CL 104 110 108  CO2 19* 26 22  GLUCOSE 188* 126* 89  BUN 23 28* 23  CREATININE 1.10 1.18 0.94  CALCIUM 9.7 8.6* 7.8*   Liver Function Tests: Recent Labs  Lab 03/28/22 1004  AST 32  ALT 20  ALKPHOS 74  BILITOT 1.7*  PROT 8.0  ALBUMIN 4.3   Recent Labs  Lab 03/28/22 1004  LIPASE 26   No results for input(s): "AMMONIA" in the last 168 hours. CBC: Recent Labs  Lab 03/28/22 1004 03/29/22 0515 03/30/22 0526  WBC 19.7* 14.6* 8.2  HGB 15.5 12.7* 11.2*  HCT 45.4 37.8* 34.0*  MCV 86.8 90.4 89.0  PLT 220 136* 117*   Cardiac Enzymes: No results for input(s): "CKTOTAL", "CKMB", "CKMBINDEX", "TROPONINI" in the last 168 hours. BNP: Invalid input(s): "POCBNP" CBG: Recent Labs  Lab 03/30/22 0645 03/30/22 0804 03/30/22 1537 03/31/22  0103 03/31/22 0800  GLUCAP 100* 89 96 118* 95   D-Dimer No results for input(s): "DDIMER" in the last 72 hours. Hgb A1c No results for input(s): "HGBA1C" in the last 72 hours. Lipid Profile No results for input(s): "CHOL", "HDL", "LDLCALC", "TRIG", "CHOLHDL", "LDLDIRECT" in the last 72 hours. Thyroid function studies No results for input(s): "TSH", "T4TOTAL", "T3FREE", "THYROIDAB" in the last 72 hours.  Invalid input(s): "FREET3" Anemia work up No results for input(s): "VITAMINB12", "FOLATE", "FERRITIN", "TIBC", "IRON", "RETICCTPCT" in the last 72 hours. Urinalysis    Component Value Date/Time   COLORURINE YELLOW (A) 03/29/2022  1232   APPEARANCEUR HAZY (A) 03/29/2022 1232   LABSPEC >1.046 (H) 03/29/2022 1232   PHURINE 5.0 03/29/2022 1232   GLUCOSEU NEGATIVE 03/29/2022 1232   HGBUR SMALL (A) 03/29/2022 1232   BILIRUBINUR NEGATIVE 03/29/2022 1232   KETONESUR NEGATIVE 03/29/2022 1232   PROTEINUR 100 (A) 03/29/2022 1232   NITRITE NEGATIVE 03/29/2022 1232   LEUKOCYTESUR NEGATIVE 03/29/2022 1232   Sepsis Labs Recent Labs  Lab 03/28/22 1004 03/29/22 0515 03/30/22 0526  WBC 19.7* 14.6* 8.2   Microbiology Recent Results (from the past 240 hour(s))  Blood Culture (routine x 2)     Status: None (Preliminary result)   Collection Time: 03/28/22  4:58 PM   Specimen: BLOOD  Result Value Ref Range Status   Specimen Description BLOOD BLOOD RIGHT HAND  Final   Special Requests   Final    BOTTLES DRAWN AEROBIC AND ANAEROBIC Blood Culture results may not be optimal due to an inadequate volume of blood received in culture bottles   Culture   Final    NO GROWTH 3 DAYS Performed at [facility], [address] [place], [place] [address]    Report Status PENDING  Incomplete  Blood Culture (routine x 2)     Status: None (Preliminary result)   Collection Time: 03/28/22  4:59 PM   Specimen: BLOOD  Result Value Ref Range Status   Specimen Description BLOOD BLOOD RIGHT HAND  Final   Special Requests   Final    BOTTLES DRAWN AEROBIC AND ANAEROBIC Blood Culture results may not be optimal due to an inadequate volume of blood received in culture bottles   Culture   Final    NO GROWTH 3 DAYS Performed at [facility], [address] [place], [place] [address]    Report Status PENDING  Incomplete  Aerobic/Anaerobic Culture w Gram Stain (surgical/deep wound)     Status: None (Preliminary result)   Collection Time: 03/29/22 12:22 PM   Specimen: Gallbladder; Bile  Result Value Ref Range Status   Specimen Description   Final    GALL BLADDER Performed at [facility], [address]  [place], [place] [address]    Special Requests   Final    BILE GALLBLADDER Performed at [facility], [address] [place], [place] [address]    Gram Stain NO WBC SEEN FEW GRAM POSITIVE RODS   Final   Culture   Final    NO GROWTH < 24 HOURS Performed at [facility], [address] [place], [place] [address]    Report Status PENDING  Incomplete     Total time spend on discharging this patient, including the last patient exam, discussing the hospital stay, instructions for ongoing care as it relates to all pertinent caregivers, as well as preparing the medical discharge records, prescriptions, and/or referrals as applicable, is 40 minutes.    Darlin Priestlyina Rakayla Ricklefs,  MD  [facility] 03/31/2022, 8:11 AM

## 2022-04-01 ENCOUNTER — Emergency Department
Admission: EM | Admit: 2022-04-01 | Discharge: 2022-04-01 | Disposition: A | Discharge status: Home / Self Care | Payer: Medicare HMO | Attending: Emergency Medicine | Admitting: Emergency Medicine

## 2022-04-01 ENCOUNTER — Other Ambulatory Visit: Payer: Self-pay

## 2022-04-01 ENCOUNTER — Emergency Department: Payer: Medicare HMO

## 2022-04-01 DIAGNOSIS — R7989 Other specified abnormal findings of blood chemistry: Secondary | ICD-10-CM | POA: Insufficient documentation

## 2022-04-01 DIAGNOSIS — I110 Hypertensive heart disease with heart failure: Secondary | ICD-10-CM | POA: Diagnosis not present

## 2022-04-01 DIAGNOSIS — I509 Heart failure, unspecified: Secondary | ICD-10-CM | POA: Diagnosis not present

## 2022-04-01 DIAGNOSIS — R1011 Right upper quadrant pain: Secondary | ICD-10-CM | POA: Diagnosis present

## 2022-04-01 DIAGNOSIS — R509 Fever, unspecified: Secondary | ICD-10-CM

## 2022-04-01 DIAGNOSIS — K819 Cholecystitis, unspecified: Secondary | ICD-10-CM

## 2022-04-01 LAB — CBC WITH DIFFERENTIAL/PLATELET
Abs Immature Granulocytes: 0.04 10*3/uL (ref 0.00–0.07)
Basophils Absolute: 0 10*3/uL (ref 0.0–0.1)
Basophils Relative: 0 %
Eosinophils Absolute: 0.3 10*3/uL (ref 0.0–0.5)
Eosinophils Relative: 4 %
HCT: 39.3 % (ref 39.0–52.0)
Hemoglobin: 13.5 g/dL (ref 13.0–17.0)
Immature Granulocytes: 1 %
Lymphocytes Relative: 14 %
Lymphs Abs: 1 10*3/uL (ref 0.7–4.0)
MCH: 29.5 pg (ref 26.0–34.0)
MCHC: 34.4 g/dL (ref 30.0–36.0)
MCV: 86 fL (ref 80.0–100.0)
Monocytes Absolute: 0.7 10*3/uL (ref 0.1–1.0)
Monocytes Relative: 11 %
Neutro Abs: 4.7 10*3/uL (ref 1.7–7.7)
Neutrophils Relative %: 70 %
Platelets: 226 10*3/uL (ref 150–400)
RBC: 4.57 MIL/uL (ref 4.22–5.81)
RDW: 13.1 % (ref 11.5–15.5)
WBC: 6.7 10*3/uL (ref 4.0–10.5)
nRBC: 0 % (ref 0.0–0.2)

## 2022-04-01 LAB — COMPREHENSIVE METABOLIC PANEL
ALT: 81 U/L — ABNORMAL HIGH (ref 0–44)
AST: 47 U/L — ABNORMAL HIGH (ref 15–41)
Albumin: 3.1 g/dL — ABNORMAL LOW (ref 3.5–5.0)
Alkaline Phosphatase: 80 U/L (ref 38–126)
Anion gap: 7 (ref 5–15)
BUN: 20 mg/dL (ref 8–23)
CO2: 23 mmol/L (ref 22–32)
Calcium: 8.5 mg/dL — ABNORMAL LOW (ref 8.9–10.3)
Chloride: 106 mmol/L (ref 98–111)
Creatinine, Ser: 0.77 mg/dL (ref 0.61–1.24)
GFR, Estimated: >60 mL/min (ref >60)
Glucose, Bld: 147 mg/dL — ABNORMAL HIGH (ref 70–99)
Potassium: 3.5 mmol/L (ref 3.5–5.1)
Sodium: 136 mmol/L (ref 135–145)
Total Bilirubin: 1 mg/dL (ref 0.3–1.2)
Total Protein: 6.8 g/dL (ref 6.5–8.1)

## 2022-04-01 LAB — AEROBIC/ANAEROBIC CULTURE W GRAM STAIN (SURGICAL/DEEP WOUND): Gram Stain: NONE SEEN

## 2022-04-01 NOTE — Discharge Instructions (Signed)
Please continue to take antibiotics as prescribed.  Please take your pain medication as needed, as written by your doctor.  Return to the emergency department for any high fever, any vomiting or any other symptom personally concerning to yourself.

## 2022-04-01 NOTE — ED Provider Notes (Signed)
[facility] Provider Note    Event Date/Time   First MD Initiated Contact with Patient 04/01/22 2030     (approximate)  History   Chief Complaint: Fever  HPI  Joel Spencer is a 86 y.o. male with a past medical history of CHF, hypertension, hyperlipidemia, arthritis, presents to the emergency department for fever.  According to the patient's wife patient was complaining of increased pain where he had a recent drain placed in his gallbladder.  Wife states had a fever of 100.2 at home today they were concerned so they brought the patient back to the emergency department for evaluation.  Patient states he continues to have pain in the right upper quadrant especially if he moves or breathes deeply.  Physical Exam   Triage Vital Signs: ED Triage Vitals  Enc Vitals Group     BP 04/01/22 1815 135/79     Pulse Rate 04/01/22 1815 86     Resp 04/01/22 1815 18     Temp 04/01/22 1815 98.6 F (37 C)     Temp Source 04/01/22 1815 Oral     SpO2 04/01/22 1815 93 %     Weight 04/01/22 1815 160 lb (72.6 kg)     Height 04/01/22 1815 5\' 8"  (1.727 m)     Head Circumference --      Peak Flow --      Pain Score 04/01/22 1828 5     Pain Loc --      Pain Edu? --      Excl. in [place]? --     Most recent vital signs: Vitals:   04/01/22 1815  BP: 135/79  Pulse: 86  Resp: 18  Temp: 98.6 F (37 C)  SpO2: 93%    General: Awake, no distress.  CV:  Good peripheral perfusion.  Regular rate and rhythm  Resp:  Normal effort.  Equal breath sounds bilaterally.  Abd:  No distention.  Soft, mild right upper quadrant tenderness.  Patient has a recent cholecystostomy tube with good amount of drainage present in the bag.   ED Results / Procedures / Treatments   EKG  EKG viewed and interpreted by myself shows a regular rhythm possibly paced at 89 bpm with a widened QRS, normal axis, normal intervals, nonspecific ST changes.  RADIOLOGY  I have reviewed and interpreted the  chest x-ray images.  Patient has a pacemaker I do not see any obvious consolidation on my evaluation. Radiology has read the x-ray as possible pneumomediastinum recommend CT to further evaluate.   MEDICATIONS ORDERED IN ED: Medications - No data to display   IMPRESSION / MDM / [place] / ED COURSE  I reviewed the triage vital signs and the nursing notes.  Patient's presentation is most consistent with acute presentation with potential threat to life or bodily function.  Patient presents emergency department for right upper quadrant abdominal pain and continued fevers at home as high as 100.2 this evening.  I reviewed the patient's discharge summary (currently incomplete) and notes from his recent admission.  Patient appears to have had cholecystitis ultimately leading to a percutaneous cholecystostomy tube.  Patient was started on IV Zosyn and fluids.  Patient discharged on Augmentin and pain medication.  Patient states since going home he has continued to have right upper quadrant abdominal pain worse with movement.  Patient denies any chest pain or shortness of breath.  Chest x-ray shows possible pneumomediastinum although it is not clear and I do not  appreciated on my interpretation.  We will obtain CT imaging of the chest to further evaluate.  While we are getting CT imaging of the chest we will also add an abdomen and pelvis to ensure proper placement of the cholecystostomy tube given the patient's continued right upper quadrant pain and now low-grade fevers.  Patient is afebrile in the emergency department 98.6 with otherwise reassuring vitals.  Lab work has resulted showing a normal CBC with a normal white blood cell count, reassuring chemistry with just mild LFT elevation and normal total bilirubin.  CT scan shows no sign of pneumo mediastinum.  Cholecystostomy tube appears to be appropriately placed.  Patient feels well.  Given the patient's reassuring work-up he is afebrile in  the emergency department normal white blood cell count and reassuring CT scans I believe the patient is safe for discharge home.  Discussed with the patient to continue his antibiotics and pain medication as needed at home follow-up with their surgeon.  Return to the emergency department for any fever 101 or higher, vomiting, or any other symptom concerning to the ourselves.  Patient's work-up and findings today appear to be consistent with expected recovery course.  FINAL CLINICAL IMPRESSION(S) / ED DIAGNOSES   Fever Abdominal pain  Note:  This document was prepared using Dragon voice recognition software and may include unintentional dictation errors.   Minna Antis, MD 04/01/22 2146

## 2022-04-01 NOTE — ED Notes (Signed)
Patient discharged at this time. Wheeled to lobby per pt request. Breathing unlabored speaking in full sentences. Verbalized understanding of all discharge, follow up, and medication teaching. Discharged homed with all belongings.   

## 2022-04-01 NOTE — ED Triage Notes (Signed)
Pt arrives with c/o fever that started after getting a drain put in for his infected gallbladder. Per family, pts fever has gotten up to 102.

## 2022-04-02 LAB — CULTURE, BLOOD (ROUTINE X 2)
Culture: NO GROWTH
Culture: NO GROWTH

## 2022-04-02 IMAGING — NM NM BONE WHOLE BODY
2 series · 12 of 12 positions shown · non-contrast
Comparison: None

Correlation: CT abdomen and pelvis 11/26/2020

CLINICAL DATA: Abnormal CT scan with sclerotic foci in L3 and L4
vertebral bodies, no history of malignancy

EXAM:
NUCLEAR MEDICINE WHOLE BODY BONE SCAN
TECHNIQUE: Whole body anterior and posterior images were obtained approximately
3 hours after intravenous injection of radiopharmaceutical.
RADIOPHARMACEUTICALS:  20.16 mCi Dechnetium-44m MDP IV

[Series 1000: 3 hr wholebody · 2.40mm/px · 2 of 2 frames shown]
[frame 1/2]
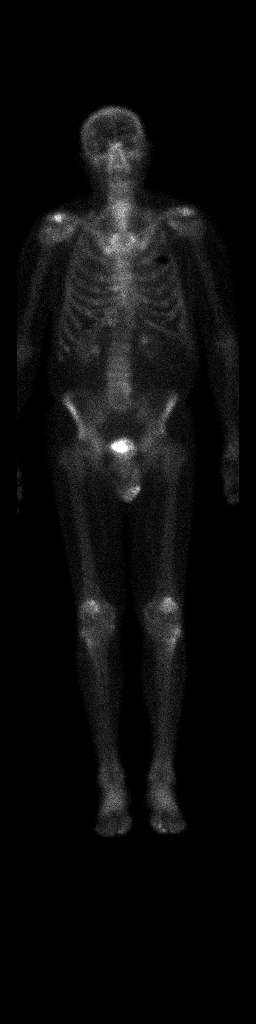
[frame 2/2]
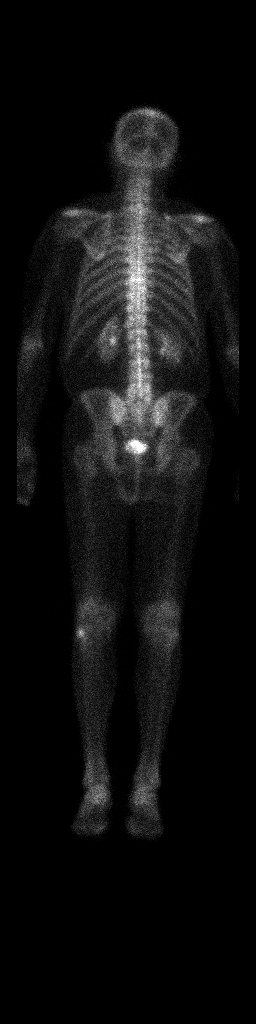

[Series 1000: statics · 2.40mm/px · 5 acquisitions, 10 frames shown]
[im 1/5]
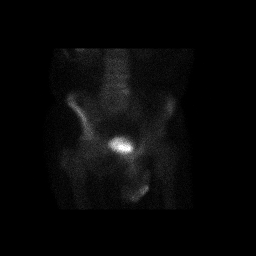
[im 1/5]
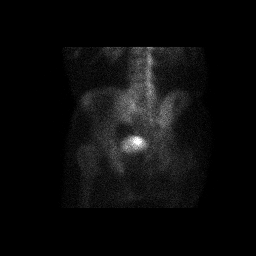
[im 2/5]
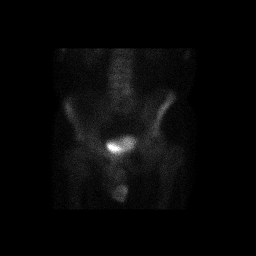
[im 2/5]
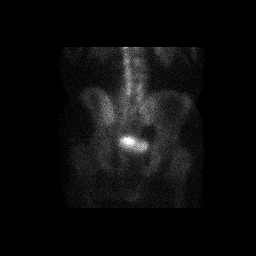
[im 3/5]
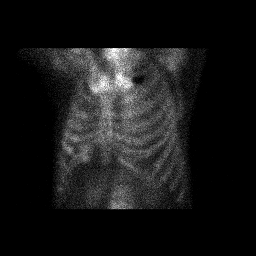
[im 3/5]
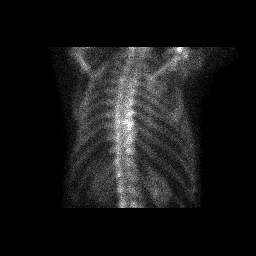
[im 4/5]
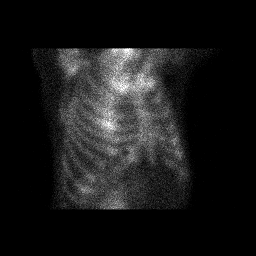
[im 4/5]
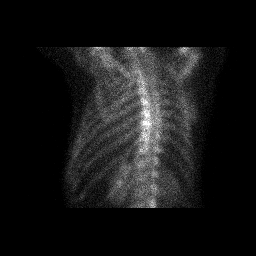
[im 5/5]
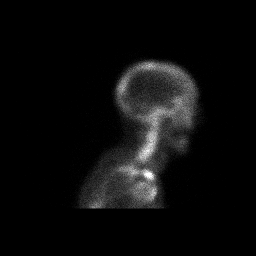
[im 5/5]
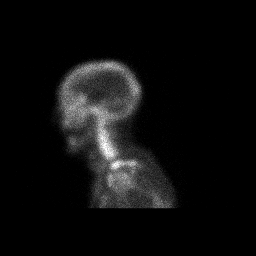

[12 of 12 positions shown; findings below may reference images not displayed]

FINDINGS: Uptake at shoulders, sternoclavicular joints, hips, knees, and feet
typically degenerative.

Uptake at T8-T9, nonspecific.

Photopenic defect at upper LEFT anterior chest wall, question
pacemaker or ICD.

Minimal uptake L4-L5 likely degenerative.

No additional sites of abnormal tracer accumulation.

Specifically no abnormal tracer uptake seen at L3-L4.

Expected urinary tract and soft tissue distribution of tracer.
IMPRESSION: Scattered degenerative type uptake as above.

No abnormal tracer uptake at L3 or L4 indicating at the previously
identified sclerotic foci are metabolically inactive and not
concerning.

Nonspecific tracer uptake at T8-T9, uncertain etiology, recommend
correlation with thoracic spine radiographs.

## 2022-04-21 ENCOUNTER — Other Ambulatory Visit: Payer: Self-pay | Admitting: Surgery

## 2022-04-21 ENCOUNTER — Telehealth: Payer: Self-pay

## 2022-04-21 ENCOUNTER — Encounter: Payer: Self-pay | Admitting: Surgery

## 2022-04-21 ENCOUNTER — Ambulatory Visit (INDEPENDENT_AMBULATORY_CARE_PROVIDER_SITE_OTHER): Payer: Medicare HMO | Admitting: Surgery

## 2022-04-21 VITALS — BP 107/77 | HR 92 | Temp 98.0°F | Ht 68.0 in | Wt 156.4 lb

## 2022-04-21 DIAGNOSIS — K810 Acute cholecystitis: Secondary | ICD-10-CM

## 2022-04-21 NOTE — Patient Instructions (Signed)
Continue flushing the drain.  I will call you with an appointment for cholangiogram.  If you have any concerns or questions, please feel free to call our office.   Cholecystitis Cholecystitis is irritation and swelling (inflammation) of the gallbladder. The gallbladder: Is an organ that is shaped like a pear. Is under the liver on the right side of the body. Stores bile. Bile helps the body break down (digest) the fats in food. This condition can occur all of a sudden. It needs to be treated. What are the causes? This condition may be caused by stones or lumps that form in the gallbladder (gallstones). Gallstones can block the tube (duct) that carries bile out of your gallbladder. Other causes include: Damage to the gallbladder due to less blood flow. Germs in the bile ducts. Scars, kinks, or adhesions in the bile ducts. Abnormal growths (tumors) in the liver, pancreas, or gallbladder. What increases the risk? You are more likely to develop this condition if: You are male and between the ages of 19-62. You take birth control pills. You use estrogen. You take certain medicines that make you more likely to develop gallstones. You are overweight (obese). You have a very bad reaction to an infection (sepsis). You have been hospitalized due to a serious condition, such as a burn or illness. You have not eaten or drank for a long time. What are the signs or symptoms? Symptoms of this condition include: Pain in the upper right part of the belly (abdomen). A lump over the gallbladder. Bloating in the belly. Feeling sick to your stomach (nauseous). Vomiting. Fever. Chills. How is this treated? This condition may be treated with: Medicines to treat pain. Giving fluids through an IV tube. Not eating or drinking (fasting). Antibiotic medicines. Surgery to take out your gallbladder. Gallbladder drainage. Follow these instructions at home: Medicines  Take over-the-counter and  prescription medicines only as told by your doctor. If you were prescribed an antibiotic medicine, take it as told by your doctor. Do not stop taking it even if you start to feel better. General instructions Follow instructions from your doctor about what to eat or drink. Do not eat or drink anything that makes you sick again. Do not smoke or use any products that contain nicotine or tobacco. If you need help quitting, ask your doctor. Keep all follow-up visits. Contact a doctor if: You have pain and your medicine does not help. You have a fever. Get help right away if: Your pain moves to: Another part of your belly. Your back. Your symptoms do not go away. You have new symptoms. These symptoms may be an emergency. Get help right away. Call 911. Do not wait to see if the symptoms will go away. Do not drive yourself to the hospital. Summary This condition may be caused by stones or lumps that form in the gallbladder (gallstones). A common symptom is pain in your belly. This condition may be treated with surgery to take out your gallbladder. Follow instructions from your doctor about what to eat or drink. This information is not intended to replace advice given to you by your health care provider. Make sure you discuss any questions you have with your health care provider. Document Revised: 11/30/2020 Document Reviewed: 11/30/2020 Elsevier Patient Education  2023 [employer].

## 2022-04-21 NOTE — Telephone Encounter (Signed)
[facility], Interventional Radiology department. Left message letting them know this patient needs scheduled for a drain study. Contact information given for them to contact the patient to schedule this.

## 2022-04-21 NOTE — Progress Notes (Signed)
04/21/2022  History of Present Illness: Joel Spencer is a 86 y.o. male presenting for follow-up of acute cholecystitis.  Patient was admitted on 03/28/2022 with acute cholecystitis with air inside the lumen of the gallbladder and significant right upper quadrant inflammatory changes.  On initial CT scan, a potential fistula between the hepatic flexure of the colon and the gallbladder could not be excluded.  He underwent percutaneous cholecystostomy drain placement on 10/18 and eventually was discharged on 03/31/2022.  He presents today for follow-up.  He reports that he has been doing well and denies any abdominal pain in the right upper quadrant.  His main complaint is decreased appetite and fatigue.  Denies any fevers or chills, chest pain, shortness of breath, nausea, vomiting.  The drain is being flushed once daily and has not had any issues with drainage.  Past Medical History: Past Medical History:  Diagnosis Date   Anginal pain (HCC)    Arthritis    Chronic systolic CHF (congestive heart failure) (HCC)    Hyperlipemia    Hypertension    Hypothyroidism    Stable angina 03/30/2016     Past Surgical History: Past Surgical History:  Procedure Laterality Date   CARDIAC CATHETERIZATION Left 03/31/2016   Procedure: Left Heart Cath and Coronary Angiography;  Surgeon: Lamar Blinks, MD;  Location: [facility] INVASIVE CV LAB;  Service: Cardiovascular;  Laterality: Left;   ESOPHAGOGASTRODUODENOSCOPY (EGD) WITH PROPOFOL N/A 02/26/2018   Procedure: ESOPHAGOGASTRODUODENOSCOPY (EGD) WITH PROPOFOL;  Surgeon: Toledo, Boykin Nearing, MD;  Location: [facility] ENDOSCOPY;  Service: Gastroenterology;  Laterality: N/A;   IR PERC CHOLECYSTOSTOMY  03/29/2022    Home Medications: Prior to Admission medications   Medication Sig Start Date End Date Taking? Authorizing Provider  acetaminophen (TYLENOL) 500 MG tablet Take 500-1,000 mg by mouth every 6 (six) hours as needed for mild pain or moderate pain.   Yes  [provider]  sodium chloride flush (NS) 0.9 % SOLN 5 mLs by Intracatheter route daily. 03/31/22 05/30/22 Yes Darlin Priestly, MD    Allergies: No Known Allergies  Review of Systems: Review of Systems  Constitutional:  Negative for chills and fever.  Respiratory:  Negative for shortness of breath.   Cardiovascular:  Negative for chest pain.  Gastrointestinal:  Negative for abdominal pain, nausea and vomiting.  Genitourinary:  Negative for dysuria.  Musculoskeletal:  Negative for myalgias.    Physical Exam BP 107/77   Pulse 92   Temp 98 F (36.7 C) (Oral)   Ht 5\' 8"  (1.727 m)   Wt 156 lb 6.4 oz (70.9 kg)   SpO2 98%   BMI 23.78 kg/m  CONSTITUTIONAL: No acute distress, well nourished. HEENT:  Normocephalic, atraumatic, extraocular motion intact. RESPIRATORY:  Lungs are clear, and breath sounds are equal bilaterally. Normal respiratory effort without pathologic use of accessory muscles. CARDIOVASCULAR: Heart is regular without murmurs, gallops, or rubs. GI: The abdomen is soft, non-distended, non-tender to palpation.  Patient has a RUQ percutaneous cholecystostomy drain in place.  The drainage fluid is not bilious but rather stool like -- liquid brown with some debris.  NEUROLOGIC:  Motor and sensation is grossly normal.  Cranial nerves are grossly intact. PSYCH:  Alert and oriented to person, place and time. Affect is normal.  Labs/Imaging: CT abdomen/pelvis 03/28/22: IMPRESSION: 1. Distended gallbladder with moderate air and considerable pericholecystic inflammatory change. No calcified stones. Wall thickening and inflammation of adjacent hepatic flexure with diverticula present. Diagnostic considerations include emphysematous cholecystitis with secondary reactive wall thickening of the  adjacent colon (favored diagnosis) versus right-sided diverticulitis with cholecystocolonic fistula (blurred fat plane between the gallbladder wall and adjacent inflamed colon.)  Surgical consultation is recommended. No signs of free air to suggest perforation. 2. Small free fluid in the pelvis and upper abdomen.  Assessment and Plan: This is a 86 y.o. male with history of acute cholecystitis status post percutaneous cholecystostomy drain placement.  -Discussed with patient that the type of drainage coming into the biliary bag appears to be more stool like in color or in characteristic instead of bilious.  His initial CT scan did raise the concern for a potential cholecysto colonic fistula.  This was not seen during drain placement on 03/29/2022 nor on subsequent CT scan on 04/01/2022.  However based on the appearance of the drainage, I do have a high suspicion that he could have a cholecysto colonic fistula. -Discussed with the patient that we will obtain a cholangiogram through the existing drain to better evaluate for this fistula.  If this were to be negative, we may need additional imaging.  I discussed with patient the if he does have a fistula, may be better to refer him to a tertiary center for further management which would include cholecystectomy as well as resection of his fistula which could potentially need a partial colectomy. - For now, I will contact the patient with the results of the cholangiogram and further imaging studies are needed.  Continue flushing the drain once daily.  Currently he does not need antibiotics.  Risk for cholangitis is low given that he has a drain in place which is functioning and draining appropriately.  Patient understands this plan and all of his questions have been answered.  I spent 30 minutes dedicated to the care of this patient on the date of this encounter to include pre-visit review of records, face-to-face time with the patient discussing diagnosis and management, and any post-visit coordination of care.   Melvyn Neth, [facility]

## 2022-04-25 ENCOUNTER — Ambulatory Visit
Admission: RE | Admit: 2022-04-25 | Discharge: 2022-04-25 | Disposition: A | Discharge status: Home / Self Care | Payer: Medicare HMO | Source: Ambulatory Visit | Attending: Surgery | Admitting: Surgery

## 2022-04-25 DIAGNOSIS — K810 Acute cholecystitis: Secondary | ICD-10-CM

## 2022-04-25 HISTORY — PX: IR CHOLANGIOGRAM EXISTING TUBE: IMG6040

## 2022-04-25 HISTORY — PX: IR RADIOLOGIST EVAL & MGMT: IMG5224

## 2022-04-25 NOTE — Progress Notes (Signed)
Chief Complaint: Patient was seen in consultation today for cholecystostomy tube in place at the request of Piscoya,Jose  Referring Physician(s): Piscoya,Jose  History of Present Illness: Joel Spencer is a 86 y.o. male With a history of emphysematous cholecystitis and percutaneous cholecystostomy tube placement on 03/30/2022.  He has since recovered and been discharged home.  He presents today for 4-week follow-up cholangiogram.  His tube is draining small amounts of bilious fluid.  He has no pain or other symptoms.  His wife is here with him.  Past Medical History:  Diagnosis Date   Anginal pain (HCC)    Arthritis    Chronic systolic CHF (congestive heart failure) (HCC)    Hyperlipemia    Hypertension    Hypothyroidism    Stable angina 03/30/2016    Past Surgical History:  Procedure Laterality Date   CARDIAC CATHETERIZATION Left 03/31/2016   Procedure: Left Heart Cath and Coronary Angiography;  Surgeon: Lamar Blinks, MD;  Location: [facility] INVASIVE CV [facility];  Service: Cardiovascular;  Laterality: Left;   ESOPHAGOGASTRODUODENOSCOPY (EGD) WITH PROPOFOL N/A 02/26/2018   Procedure: ESOPHAGOGASTRODUODENOSCOPY (EGD) WITH PROPOFOL;  Surgeon: Toledo, Boykin Nearing, MD;  Location: [facility] ENDOSCOPY;  Service: Gastroenterology;  Laterality: N/A;   IR CHOLANGIOGRAM EXISTING TUBE  04/25/2022   IR PERC CHOLECYSTOSTOMY  03/29/2022   IR RADIOLOGIST EVAL & MGMT  04/25/2022    Allergies: Patient has no known allergies.  Medications: Prior to Admission medications   Medication Sig Start Date End Date Taking? Authorizing Provider  acetaminophen (TYLENOL) 500 MG tablet Take 500-1,000 mg by mouth every 6 (six) hours as needed for mild pain or moderate pain.    [provider]  sodium chloride flush (NS) 0.9 % SOLN 5 mLs by Intracatheter route daily. 03/31/22 05/30/22  Darlin Priestly, MD     Family History  Problem Relation Age of Onset   Bone cancer Mother    Diabetes Father      Social History   Socioeconomic History   Marital status: Married    Spouse name: Not on file   Number of children: Not on file   Years of education: Not on file   Highest education level: Not on file  Occupational History   Not on file  Tobacco Use   Smoking status: Never   Smokeless tobacco: Never  Substance and Sexual Activity   Alcohol use: No   Drug use: No   Sexual activity: Not on file  Other Topics Concern   Not on file  Social History Narrative   Not on file   Social Determinants of Health   Financial Resource Strain: Not on file  Food Insecurity: No Food Insecurity (03/29/2022)   Hunger Vital Sign    Worried About Running Out of Food in the Last Year: Never true    Ran Out of Food in the Last Year: Never true  Transportation Needs: No Transportation Needs (03/29/2022)   PRAPARE - Administrator, Civil Service (Medical): No    Lack of Transportation (Non-Medical): No  Physical Activity: Not on file  Stress: Not on file  Social Connections: Not on file    Review of Systems: A 12 point ROS discussed and pertinent positives are indicated in the HPI above.  All other systems are negative.  Review of Systems  Vital Signs: There were no vitals taken for this visit.   Physical Exam Constitutional:      Appearance: Normal appearance.  HENT:     Head:  Normocephalic and atraumatic.  Eyes:     General: No scleral icterus. Cardiovascular:     Rate and Rhythm: Normal rate.  Pulmonary:     Effort: Pulmonary effort is normal.  Abdominal:     General: Abdomen is flat. There is no distension.     Tenderness: There is no abdominal tenderness. There is no guarding.  Skin:    General: Skin is warm and dry.  Neurological:     Mental Status: He is alert.  Psychiatric:        Behavior: Behavior normal.       Imaging: IR CHOLANGIOGRAM EXISTING TUBE  Result Date: 04/25/2022 INDICATION: 86 year old male with acute emphysematous cholecystitis.  He underwent percutaneous cholecystostomy tube placement on 03/29/2022 and presents today for follow-up. EXAM: CHOLANGIOGRAM VIA EXISTING CATHETER MEDICATIONS: None. ANESTHESIA/SEDATION: None. FLUOROSCOPY TIME:  Radiation exposure index: COMPLICATIONS: None immediate. PROCEDURE: Eight cholangiogram was performed by injecting the existing percutaneous cholecystostomy tube. The gallbladder is decompressed. The cystic duct and common bile duct are widely patent. Contrast material passes freely through the ampulla and into the duodenum. No evidence of fistulization to the colon. IMPRESSION: 1. Restored patency of the cystic and common bile ducts. 2. No evidence of fistula to the colon. 3. Well-positioned percutaneous cholecystostomy tube. Electronically Signed   By: Malachy MoanHeath  Katarzyna Wolven M.D.   On: 04/25/2022 11:57   IR Radiologist Eval & Mgmt  Result Date: 04/25/2022 EXAM: NEW PATIENT OFFICE VISIT CHIEF COMPLAINT: SEE EPIC NOTE HISTORY OF PRESENT ILLNESS: SEE EPIC NOTE REVIEW OF SYSTEMS: SEE EPIC NOTE PHYSICAL EXAMINATION: SEE EPIC NOTE ASSESSMENT AND PLAN: SEE EPIC NOTE Electronically Signed   By: Malachy MoanHeath  Makenna Macaluso M.D.   On: 04/25/2022 11:54   CT CHEST ABDOMEN PELVIS WO CONTRAST  Result Date: 04/01/2022 CLINICAL DATA:  Esophageal perforation. Fever started after drain placed in infected gallbladder. Possible pneumomediastinum on chest radiograph. EXAM: CT CHEST, ABDOMEN AND PELVIS WITHOUT CONTRAST TECHNIQUE: Multidetector CT imaging of the chest, abdomen and pelvis was performed following the standard protocol without IV contrast. RADIATION DOSE REDUCTION: This exam was performed according to the departmental dose-optimization program which includes automated exposure control, adjustment of the mA and/or kV according to patient size and/or use of iterative reconstruction technique. COMPARISON:  Chest radiograph 04/01/2022. CT abdomen and pelvis 03/28/2022. FINDINGS: CT CHEST FINDINGS Cardiovascular: Normal  heart size. Mild pericardial effusion. Cardiac pacemaker. Normal caliber thoracic aorta. Minimal aortic and coronary artery calcification. Mediastinum/Nodes: Thyroid gland is unremarkable. Esophagus is decompressed. No significant lymphadenopathy in the chest. No free fluid or free air in the mediastinum. No evidence of pneumomediastinum. Radiographic changes likely represented artifact from vascular compression. Lungs/Pleura: Small right pleural effusion with atelectasis or consolidation in the right lung base. Linear atelectasis in the left base. Mild emphysematous changes in the apices. Airways are patent. Musculoskeletal: Degenerative changes.  No acute bony abnormalities. CT ABDOMEN PELVIS FINDINGS Hepatobiliary: Interval placement of a percutaneous cholecystostomy tube with pigtail in the lumen of the gallbladder. The gallbladder is decompressed since the previous study. There is some residual air in the gallbladder. Inflammatory stranding and edema around the gallbladder remains present. Small amount of free fluid adjacent to the liver edge. Low-attenuation lesions in the liver, segments 2 and 5, likely representing cysts. No change. Pancreas: Unremarkable. No pancreatic ductal dilatation or surrounding inflammatory changes. Spleen: Normal in size without focal abnormality. Adrenals/Urinary Tract: No adrenal gland nodules. Parapelvic cysts in the kidneys without change. No imaging follow-up is indicated. No hydronephrosis or hydroureter. Bladder is normal. Stomach/Bowel:  Stomach, small bowel, and colon are not abnormally distended. No wall thickening or inflammatory changes. Scattered colonic diverticula without evidence of acute diverticulitis. Appendix is normal. Vascular/Lymphatic: Calcification of the aorta.  No aneurysm. Reproductive: Prostate is unremarkable. Other: No free air in the abdomen. No loculated collection to suggest abscess. Abdominal wall musculature appears intact. Musculoskeletal:  Degenerative changes.  No acute bony abnormalities. IMPRESSION: 1. No evidence of pneumomediastinum, esophageal perforation, or mediastinal abscess. 2. Small right pleural effusion with atelectasis in the right lung base. 3. Interval placement of cholecystostomy tube with decompression of the gallbladder. Residual edema and inflammatory infiltration around the gallbladder. No focal abscess. 4. Aortic atherosclerosis. Electronically Signed   By: Burman Nieves M.D.   On: 04/01/2022 21:15   DG Chest 2 View  Result Date: 04/01/2022 CLINICAL DATA:  Infection. Fever beginning after gallbladder drain placed. EXAM: CHEST - 2 VIEW COMPARISON:  CT abdomen and pelvis 03/28/2002 FINDINGS: Cardiac pacemaker. Shallow inspiration with elevation of the right hemidiaphragm. Atelectasis in the right base. No airspace disease or consolidation. No pleural effusions. No pneumothorax. Mediastinal contours appear intact. Linear gas shadow is suggested along the descending aorta. This could represent artifact but pneumomediastinum could be present. CT chest suggested for further evaluation. Pigtail drainage catheter demonstrated in the right upper quadrant. Degenerative changes in the spine. IMPRESSION: 1. Possible pneumomediastinum.  CT suggested for further evaluation. 2. Elevation of the right hemidiaphragm with atelectasis in the right lung base. Critical Value/emergent results were called by telephone at the time of interpretation on 04/01/2022 at 7:04 pm to provider [facility] , who verbally acknowledged these results. Electronically Signed   By: Burman Nieves M.D.   On: 04/01/2022 19:08   IR Perc Cholecystostomy  Result Date: 03/29/2022 CLINICAL DATA:  Emphysematous cholecystitis and need for percutaneous cholecystostomy due to poor candidacy for cholecystectomy at this time. EXAM: PERCUTANEOUS CHOLECYSTOSTOMY TUBE PLACEMENT COMPARISON:  CT of the abdomen on 03/28/2022 ANESTHESIA/SEDATION: Moderate (conscious)  sedation was employed during this procedure. A total of Versed 1.0 mg and Fentanyl 50 mcg was administered intravenously. Moderate Sedation Time: 12 minutes. The patient's level of consciousness and vital signs were monitored continuously by radiology nursing throughout the procedure under my direct supervision. CONTRAST:  7mL OMNIPAQUE IOHEXOL 300 MG/ML  SOLN MEDICATIONS: None FLUOROSCOPY TIME:  18 seconds.  4.5 mGy. PROCEDURE: The procedure, risks, benefits, and alternatives were explained to the patient and his wife. Questions regarding the procedure were encouraged and answered. The patient and his wife understand and consent to the procedure. The patient's wife signed for the patient. The right abdominal wall was prepped with chlorhexidine in a sterile fashion, and a sterile drape was applied covering the operative field. A sterile gown and sterile gloves were used for the procedure. Local anesthesia was provided with 1% Lidocaine. Ultrasound image documentation was performed. Fluoroscopy during the procedure and fluoro spot radiograph confirms appropriate catheter position. Ultrasound was utilized to localize the gallbladder. Under direct ultrasound guidance, an 18 gauge trocar needle was advanced into the gallbladder lumen. Aspiration was performed and a bile sample sent for culture studies. A guidewire was then advanced into the gallbladder. Percutaneous tract dilatation was then performed over a guidewire to 10-French. A 10-French pigtail drainage catheter was then advanced into the gallbladder lumen under fluoroscopy. Catheter was formed and injected with contrast material to confirm position. The catheter was flushed and connected to a gravity drainage bag. It was secured at the skin with a Prolene retention suture and adhesive retention  device. COMPLICATIONS: None FINDINGS: After needle puncture of the gallbladder, a sample of dark bile was aspirated and sent for culture. The cholecystostomy tube was  advanced into the gallbladder lumen and formed. It is now draining bile. This tube will be left to gravity drainage. IMPRESSION: Percutaneous cholecystostomy with placement of 10-French drainage catheter into the gallbladder lumen. This was left to gravity drainage. Electronically Signed   By: Irish Lack M.D.   On: 03/29/2022 12:39   CT Abdomen Pelvis W Contrast  Result Date: 03/28/2022 CLINICAL DATA:  Acute right flank pain EXAM: CT ABDOMEN AND PELVIS WITH CONTRAST TECHNIQUE: Multidetector CT imaging of the abdomen and pelvis was performed using the standard protocol following bolus administration of intravenous contrast. RADIATION DOSE REDUCTION: This exam was performed according to the departmental dose-optimization program which includes automated exposure control, adjustment of the mA and/or kV according to patient size and/or use of iterative reconstruction technique. CONTRAST:  OMNIPAQUE IOHEXOL 300 MG/ML  SOLN COMPARISON:  CT 11/26/2020, bone scan 12/27/2020 FINDINGS: Lower chest: Lung bases demonstrate no acute airspace disease. Mild dependent atelectasis. Ectatic ascending aortic diameter up to 3.9 cm. Partially visualized cardiac pacing leads. Hepatobiliary: Circumscribed hypodense liver lesions without significant change. Posterior right hepatic lobe lesion is consistent with cyst containing thin septation. Left hepatic lobe lesion could reflect complicated cyst versus small hemangioma. Distended gallbladder with moderate air in the lumen. Considerable inflammation in the right upper quadrant. No biliary dilatation Pancreas: Unremarkable. No pancreatic ductal dilatation or surrounding inflammatory changes. Spleen: Normal in size without focal abnormality. Adrenals/Urinary Tract: Adrenal glands are normal. Bilateral parapelvic cysts, no imaging follow-up is recommended. No hydronephrosis. The bladder is unremarkable Stomach/Bowel: The stomach is nonenlarged. No dilated small bowel.  Retrocecal appendix which is normal in caliber. Diffuse diverticular disease of the colon. Focal wall thickening of the hepatic flexure with indistinct plane between the inflamed colon and gallbladder, coronal series 5, image 31. Vascular/Lymphatic: Nonaneurysmal aorta with atherosclerosis. No suspicious lymph nodes Reproductive: Prostate appears slightly enlarged Other: No free air.  Small free fluid in the pelvis. Musculoskeletal: No acute osseous abnormality. Similar small sclerotic foci within the L3 and L4 vertebra. IMPRESSION: 1. Distended gallbladder with moderate air and considerable pericholecystic inflammatory change. No calcified stones. Wall thickening and inflammation of adjacent hepatic flexure with diverticula present. Diagnostic considerations include emphysematous cholecystitis with secondary reactive wall thickening of the adjacent colon (favored diagnosis) versus right-sided diverticulitis with cholecystocolonic fistula (blurred fat plane between the gallbladder wall and adjacent inflamed colon.) Surgical consultation is recommended. No signs of free air to suggest perforation. 2. Small free fluid in the pelvis and upper abdomen. Critical Value/emergent results were called by telephone at the time of interpretation on 03/28/2022 at 4:51 pm to provider [facility] , who verbally acknowledged these results. Electronically Signed   By: Jasmine Pang M.D.   On: 03/28/2022 16:52    Labs:  CBC: Recent Labs    03/28/22 1004 03/29/22 0515 03/30/22 0526 04/01/22 1816  WBC 19.7* 14.6* 8.2 6.7  HGB 15.5 12.7* 11.2* 13.5  HCT 45.4 37.8* 34.0* 39.3  PLT 220 136* 117* 226    COAGS: Recent Labs    03/28/22 1658  INR 1.5*  APTT 33    BMP: Recent Labs    03/28/22 1004 03/29/22 0515 03/30/22 0526 04/01/22 1816  NA 137 140 137 136  K 4.0 4.9 3.8 3.5  CL 104 110 108 106  CO2 19* 26 22 23   GLUCOSE 188* 126* 89 147*  BUN 23 28* 23 20  CALCIUM 9.7 8.6* 7.8* 8.5*  CREATININE 1.10  1.18 0.94 0.77  GFRNONAA >60 >60 >60 >60    LIVER FUNCTION TESTS: Recent Labs    03/28/22 1004 04/01/22 1816  BILITOT 1.7* 1.0  AST 32 47*  ALT 20 81*  ALKPHOS 74 80  PROT 8.0 6.8  ALBUMIN 4.3 3.1*    TUMOR MARKERS: No results for input(s): "AFPTM", "CEA", "CA199", "CHROMGRNA" in the last 8760 hours.  Assessment and Plan:  Doing well with percutaneous cholecystostomy tube in place.  Restored patency of the cystic duct and no evidence of obstruction in the common bile duct.  Also, no evidence of fistulization with the adjacent colon.  The cholecystostomy tube was capped and the patient sent home with a bag and instructions to reconnect the back should he experience any symptoms including right upper quadrant pain, pain after eating, nausea, vomiting, fever or chills.  1.) Patient to follow-up with general surgery soon.  If definitive cholecystectomy surgery is delayed by more than 6 weeks, patient should return to [facility] for cholecystostomy tube exchange.    Electronically Signed: Sterling Big 04/25/2022, 12:03 PM   I spent a total of   15 Minutes in face to face in clinical consultation, greater than 50% of which was counseling/coordinating care for history of cholecystitis with percutaneous cholecystectomy tube in place

## 2022-04-27 ENCOUNTER — Telehealth: Payer: Self-pay

## 2022-04-27 NOTE — Telephone Encounter (Signed)
Left message regarding appointment information 05/24/22 @ 10 am with Dr.Piscoya.  Medical Clearance faxed to Dr Marcello Fennel.  Cardiac clearance faxed to Dr. Gwen Pounds.

## 2022-05-09 ENCOUNTER — Other Ambulatory Visit: Payer: Self-pay | Admitting: Surgery

## 2022-05-09 DIAGNOSIS — K810 Acute cholecystitis: Secondary | ICD-10-CM

## 2022-05-15 NOTE — Progress Notes (Unsigned)
Cardiac Clearance received from Dr Philemon Kingdom office. The patient is cleared at Low risk for surgery.

## 2022-05-18 ENCOUNTER — Telehealth: Payer: Self-pay | Admitting: *Deleted

## 2022-05-18 NOTE — Telephone Encounter (Signed)
Patient has a gallbladder drain and his Daughter in law is flushing and taking care of the drain for him and she has some questions she wants to go over with the nurse. She noticed that when she is flushing the drain that a lot is coming out of the drain at the top on the skin and also there is come pale watery blood in the drain when flushing. She stated that the patient feels fine, no nausea, vomiting or fevers. She wants to know if she is taking care of the drain properly. Please call Lenin Kuhnle at [phone], she is at work so you may have to leave a message and she will call back.

## 2022-05-18 NOTE — Telephone Encounter (Signed)
Flushing daily. She is noticing a little bit of blood mixed with the other fluid when flushing the line. Denies fever, chills or nausea or vomiting. Eating well.

## 2022-05-18 NOTE — Telephone Encounter (Signed)
Kimber Relic, Carlus Pavlov called back about her father in law, she missed your call.  Sorry but I was not able to help her.  Please give her a call back when you get a chance at [phone].  Thanks

## 2022-05-18 NOTE — Telephone Encounter (Signed)
Tried reaching [facility] at this time. Left VM to call office.

## 2022-05-22 ENCOUNTER — Telehealth: Payer: Self-pay

## 2022-05-22 ENCOUNTER — Encounter: Payer: Self-pay | Admitting: Surgery

## 2022-05-22 ENCOUNTER — Other Ambulatory Visit: Payer: Self-pay

## 2022-05-22 ENCOUNTER — Other Ambulatory Visit: Payer: Self-pay | Admitting: Student

## 2022-05-22 ENCOUNTER — Ambulatory Visit: Payer: Medicare HMO | Admitting: Surgery

## 2022-05-22 VITALS — BP 129/84 | HR 91 | Temp 97.7°F | Ht 68.0 in | Wt 158.0 lb

## 2022-05-22 DIAGNOSIS — K810 Acute cholecystitis: Secondary | ICD-10-CM

## 2022-05-22 DIAGNOSIS — K819 Cholecystitis, unspecified: Secondary | ICD-10-CM

## 2022-05-22 DIAGNOSIS — Z95810 Presence of automatic (implantable) cardiac defibrillator: Secondary | ICD-10-CM | POA: Insufficient documentation

## 2022-05-22 NOTE — Telephone Encounter (Signed)
Spoke with wife- confirmed cholangiogram appointment for tomorrow.

## 2022-05-22 NOTE — Telephone Encounter (Signed)
The patient is scheduled for a Cholangiogram at [facility] for tomorrow 05/23/22. He will need to arrive there by 11:00 am and go in through the [facility] entrance that is located to the right of the Emergency room entrance. I have left a message for them to call us back to confirm they received this message.

## 2022-05-22 NOTE — Telephone Encounter (Signed)
Left Marylu Lund a message to call me back-need to schedule Cholangiogram and drain exchange for tomorrow on this patient per Dr.Piscoya.

## 2022-05-22 NOTE — Progress Notes (Signed)
05/22/2022  History of Present Illness: Joel Spencer is a 86 y.o. male presenting for follow up of acute cholecystitis.  He was last seen on 04/21/22 at which time a cholangiogram was ordered due to the color of fluid that was coming out of the drain and concern for possible fistula to the hepatic flexure of the colon.  Cholangiogram was done on 04/25/22 which showed a patent cystic duct and no evidence of fistulization with the adjacent colon.  The drain was capped but the patient had pain a few days after and the drain was reconnected to a gravity bag.  Last week, he called the office stating that when flushing the drain, they would see fluid come from around the drain insertion site.  There is no leakage around the drain outside of times when it's flushed.  He denies any pain with flushing, and the drain continues to work.  He is also wondering when his surgery would be done.  Past Medical History: Past Medical History:  Diagnosis Date   Anginal pain (HCC)    Arthritis    Chronic systolic CHF (congestive heart failure) (HCC)    Hyperlipemia    Hypertension    Hypothyroidism    Stable angina 03/30/2016     Past Surgical History: Past Surgical History:  Procedure Laterality Date   CARDIAC CATHETERIZATION Left 03/31/2016   Procedure: Left Heart Cath and Coronary Angiography;  Surgeon: Lamar Blinks, MD;  Location: [facility]ASIVE CV [facility];  Service: Cardiovascular;  Laterality: Left;   ESOPHAGOGASTRODUODENOSCOPY (EGD) WITH PROPOFOL N/A 02/26/2018   Procedure: ESOPHAGOGASTRODUODENOSCOPY (EGD) WITH PROPOFOL;  Surgeon: Toledo, Boykin Nearing, MD;  Location: [facility] ENDOSCOPY;  Service: Gastroenterology;  Laterality: N/A;   IR CHOLANGIOGRAM EXISTING TUBE  04/25/2022   IR PERC CHOLECYSTOSTOMY  03/29/2022   IR RADIOLOGIST EVAL & MGMT  04/25/2022    Home Medications: Prior to Admission medications   Medication Sig Start Date End Date Taking? Authorizing Provider  acetaminophen (TYLENOL) 500 MG  tablet Take 500-1,000 mg by mouth every 6 (six) hours as needed for mild pain or moderate pain.   Yes [provider]  sodium chloride flush (NS) 0.9 % SOLN 5 mLs by Intracatheter route daily. 03/31/22 05/30/22 Yes Darlin Priestly, MD    Allergies: Allergies  Allergen Reactions   Statins Other (See Comments)    Joint pain    Review of Systems: Review of Systems  Constitutional:  Negative for chills and fever.  Respiratory:  Negative for shortness of breath.   Cardiovascular:  Negative for chest pain.  Gastrointestinal:  Negative for abdominal pain, nausea and vomiting.  Genitourinary:  Negative for dysuria.  Skin:        Drainage around catheter site.    Physical Exam BP 129/84   Pulse 91   Temp 97.7 F (36.5 C) (Oral)   Ht 5\' 8"  (1.727 m)   Wt 158 lb (71.7 kg)   SpO2 98%   BMI 24.02 kg/m  CONSTITUTIONAL: No acute distress, well nourished. HEENT:  Normocephalic, atraumatic, extraocular motion intact. RESPIRATORY:  Lungs are clear, and breath sounds are equal bilaterally. Normal respiratory effort without pathologic use of accessory muscles. CARDIOVASCULAR: Heart is regular without murmurs, gallops, or rubs. GI: The abdomen is soft, non-distended, non-tender to palpation.  The drain is in place and within the drain clip, but there is no skin suture present.  I tried to flush the drain, and there is no significant resistance, but after about 1 ml of saline going in,  there starts to be leakage of fluid around the drain insertion site, with similar characteristic as what comes out into the drain bag.  I am unable to aspirate fluid back.  NEUROLOGIC:  Motor and sensation is grossly normal.  Cranial nerves are grossly intact. PSYCH:  Alert and oriented to person, place and time. Affect is normal.  Labs/Imaging: Cholangiogram 04/25/22: IMPRESSION: 1. Restored patency of the cystic and common bile ducts. 2. No evidence of fistula to the colon. 3. Well-positioned percutaneous  cholecystostomy tube.  Assessment and Plan: This is a 86 y.o. male with acute cholecystitis, s/p percutaneous cholecystostomy drain placement.  --Discussed with the patient that although the drain is having output at home, it's not normal for the flush fluid to leak around the insertion site.  It may be that the drain is partially clogged and allowing some fluid to drain into the bag and some to leak around.  He's not having pain so at least the cystic duct might not be obstructed.  However, he had recurrent pain after capping his drain after cholangiogram, so my worry is that the same could happen again if the drain is not working well.  Discussed with him and his family the plan for ordering a cholangiogram through the drain, and may need drain exchange if not working or not in good position.   --For now, otherwise, we would be planning for robotic cholecystectomy on 06/13/21.  We have obtained both medical and cardiology clearance for surgery.  Reviewed with him the surgery at length including the incisions, the risks of bleeding, infection, injury to surrounding structures, that this would be an outpatient surgery, post-operative activity restrictions, pain control, and he's willing to proceed. --All of his questions have been answered.  I spent 30 minutes dedicated to the care of this patient on the date of this encounter to include pre-visit review of records, face-to-face time with the patient discussing diagnosis and management, and any post-visit coordination of care.   Howie Ill, MD [facility]

## 2022-05-22 NOTE — H&P (View-Only) (Signed)
05/22/2022  History of Present Illness: Joel Spencer is a 86 y.o. male presenting for follow up of acute cholecystitis.  He was last seen on 04/21/22 at which time a cholangiogram was ordered due to the color of fluid that was coming out of the drain and concern for possible fistula to the hepatic flexure of the colon.  Cholangiogram was done on 04/25/22 which showed a patent cystic duct and no evidence of fistulization with the adjacent colon.  The drain was capped but the patient had pain a few days after and the drain was reconnected to a gravity bag.  Last week, he called the office stating that when flushing the drain, they would see fluid come from around the drain insertion site.  There is no leakage around the drain outside of times when it's flushed.  He denies any pain with flushing, and the drain continues to work.  He is also wondering when his surgery would be done.  Past Medical History: Past Medical History:  Diagnosis Date   Anginal pain (HCC)    Arthritis    Chronic systolic CHF (congestive heart failure) (HCC)    Hyperlipemia    Hypertension    Hypothyroidism    Stable angina 03/30/2016     Past Surgical History: Past Surgical History:  Procedure Laterality Date   CARDIAC CATHETERIZATION Left 03/31/2016   Procedure: Left Heart Cath and Coronary Angiography;  Surgeon: Lamar Blinks, MD;  Location: [facility] INVASIVE CV [facility];  Service: Cardiovascular;  Laterality: Left;   ESOPHAGOGASTRODUODENOSCOPY (EGD) WITH PROPOFOL N/A 02/26/2018   Procedure: ESOPHAGOGASTRODUODENOSCOPY (EGD) WITH PROPOFOL;  Surgeon: Toledo, Boykin Nearing, MD;  Location: [facility] ENDOSCOPY;  Service: Gastroenterology;  Laterality: N/A;   IR CHOLANGIOGRAM EXISTING TUBE  04/25/2022   IR PERC CHOLECYSTOSTOMY  03/29/2022   IR RADIOLOGIST EVAL & MGMT  04/25/2022    Home Medications: Prior to Admission medications   Medication Sig Start Date End Date Taking? Authorizing Provider  acetaminophen (TYLENOL) 500 MG  tablet Take 500-1,000 mg by mouth every 6 (six) hours as needed for mild pain or moderate pain.   Yes [provider]  sodium chloride flush (NS) 0.9 % SOLN 5 mLs by Intracatheter route daily. 03/31/22 05/30/22 Yes Darlin Priestly, MD    Allergies: Allergies  Allergen Reactions   Statins Other (See Comments)    Joint pain    Review of Systems: Review of Systems  Constitutional:  Negative for chills and fever.  Respiratory:  Negative for shortness of breath.   Cardiovascular:  Negative for chest pain.  Gastrointestinal:  Negative for abdominal pain, nausea and vomiting.  Genitourinary:  Negative for dysuria.  Skin:        Drainage around catheter site.    Physical Exam BP 129/84   Pulse 91   Temp 97.7 F (36.5 C) (Oral)   Ht 5\' 8"  (1.727 m)   Wt 158 lb (71.7 kg)   SpO2 98%   BMI 24.02 kg/m  CONSTITUTIONAL: No acute distress, well nourished. HEENT:  Normocephalic, atraumatic, extraocular motion intact. RESPIRATORY:  Lungs are clear, and breath sounds are equal bilaterally. Normal respiratory effort without pathologic use of accessory muscles. CARDIOVASCULAR: Heart is regular without murmurs, gallops, or rubs. GI: The abdomen is soft, non-distended, non-tender to palpation.  The drain is in place and within the drain clip, but there is no skin suture present.  I tried to flush the drain, and there is no significant resistance, but after about 1 ml of saline going in,  there starts to be leakage of fluid around the drain insertion site, with similar characteristic as what comes out into the drain bag.  I am unable to aspirate fluid back.  NEUROLOGIC:  Motor and sensation is grossly normal.  Cranial nerves are grossly intact. PSYCH:  Alert and oriented to person, place and time. Affect is normal.  Labs/Imaging: Cholangiogram 04/25/22: IMPRESSION: 1. Restored patency of the cystic and common bile ducts. 2. No evidence of fistula to the colon. 3. Well-positioned percutaneous  cholecystostomy tube.  Assessment and Plan: This is a 86 y.o. male with acute cholecystitis, s/p percutaneous cholecystostomy drain placement.  --Discussed with the patient that although the drain is having output at home, it's not normal for the flush fluid to leak around the insertion site.  It may be that the drain is partially clogged and allowing some fluid to drain into the bag and some to leak around.  He's not having pain so at least the cystic duct might not be obstructed.  However, he had recurrent pain after capping his drain after cholangiogram, so my worry is that the same could happen again if the drain is not working well.  Discussed with him and his family the plan for ordering a cholangiogram through the drain, and may need drain exchange if not working or not in good position.   --For now, otherwise, we would be planning for robotic cholecystectomy on 06/13/21.  We have obtained both medical and cardiology clearance for surgery.  Reviewed with him the surgery at length including the incisions, the risks of bleeding, infection, injury to surrounding structures, that this would be an outpatient surgery, post-operative activity restrictions, pain control, and he's willing to proceed. --All of his questions have been answered.  I spent 30 minutes dedicated to the care of this patient on the date of this encounter to include pre-visit review of records, face-to-face time with the patient discussing diagnosis and management, and any post-visit coordination of care.   Howie Ill, MD [facility]

## 2022-05-22 NOTE — Patient Instructions (Addendum)
Our surgery scheduler will call you within [phone] hours to schedule your surgery. Please have the Blue surgery sheet available when speaking with her.     Minimally Invasive Cholecystectomy Minimally invasive cholecystectomy is surgery to remove the gallbladder. The gallbladder is a pear-shaped organ that lies beneath the liver on the right side of the body. The gallbladder stores bile, which is a fluid that helps the body digest fats. Cholecystectomy is often done to treat inflammation (irritation and swelling) of the gallbladder (cholecystitis). This condition is usually caused by a buildup of gallstones (cholelithiasis) in the gallbladder or when the fluid in the gall bladder becomes stagnant because gallstones get stuck in the ducts (tubes) and block the flow of bile. This can result in inflammation and pain. In severe cases, emergency surgery may be required. This procedure is done through small incisions in the abdomen, instead of one large incision. It is also called laparoscopic surgery. A thin scope with a camera (laparoscope) is inserted through one incision. Then surgical instruments are inserted through the other incisions. In some cases, a minimally invasive surgery may need to be changed to a surgery that is done through a larger incision. This is called open surgery. Tell a health care provider about: Any allergies you have. All medicines you are taking, including vitamins, herbs, eye drops, creams, and over-the-counter medicines. Any problems you or family members have had with anesthetic medicines. Any bleeding problems you have. Any surgeries you have had. Any medical conditions you have. Whether you are pregnant or may be pregnant. What are the risks? Generally, this is a safe procedure. However, problems may occur, including: Infection. Bleeding. Allergic reactions to medicines. Damage to nearby structures or organs. A gallstone remaining in the common bile duct. The common  bile duct carries bile from the gallbladder to the small intestine. A bile leak from the liver or cystic duct after your gallbladder is removed. What happens before the procedure? When to stop eating and drinking Follow instructions from your health care provider about what you may eat and drink before your procedure. These may include: 8 hours before the procedure Stop eating most foods. Do not eat meat, fried foods, or fatty foods. Eat only light foods, such as toast or crackers. All liquids are okay except energy drinks and alcohol. 6 hours before the procedure Stop eating. Drink only clear liquids, such as water, clear fruit juice, black coffee, plain tea, and sports drinks. Do not drink energy drinks or alcohol. 2 hours before the procedure Stop drinking all liquids. You may be allowed to take medicines with small sips of water. If you do not follow your health care provider's instructions, your procedure may be delayed or canceled. Medicines Ask your health care provider about: Changing or stopping your regular medicines. This is especially important if you are taking diabetes medicines or blood thinners. Taking medicines such as aspirin and ibuprofen. These medicines can thin your blood. Do not take these medicines unless your health care provider tells you to take them. Taking over-the-counter medicines, vitamins, herbs, and supplements. General instructions If you will be going home right after the procedure, plan to have a responsible adult: Take you home from the hospital or clinic. You will not be allowed to drive. Care for you for the time you are told. Do not use any products that contain nicotine or tobacco for at least 4 weeks before the procedure. These products include cigarettes, chewing tobacco, and vaping devices, such as e-cigarettes. If you  need help quitting, ask your health care provider. Ask your health care provider: How your surgery site will be marked. What  steps will be taken to help prevent infection. These may include: Removing hair at the surgery site. Washing skin with a germ-killing soap. Taking antibiotic medicine. What happens during the procedure?  An IV will be inserted into one of your veins. You will be given one or both of the following: A medicine to help you relax (sedative). A medicine to make you fall asleep (general anesthetic). Your surgeon will make several small incisions in your abdomen. The laparoscope will be inserted through one of the small incisions. The camera on the laparoscope will send images to a monitor in the operating room. This lets your surgeon see inside your abdomen. A gas will be pumped into your abdomen. This will expand your abdomen to give the surgeon more room to perform the surgery. Other tools that are needed for the procedure will be inserted through the other incisions. The gallbladder will be removed through one of the incisions. Your common bile duct may be examined. If stones are found in the common bile duct, they may be removed. After your gallbladder has been removed, the incisions will be closed with stitches (sutures), staples, or skin glue. Your incisions will be covered with a bandage (dressing). The procedure may vary among health care providers and hospitals. What happens after the procedure? Your blood pressure, heart rate, breathing rate, and blood oxygen level will be monitored until you leave the hospital or clinic. You will be given medicines as needed to control your pain. You may have a drain placed in the incision. The drain will be removed a day or two after the procedure. Summary Minimally invasive cholecystectomy, also called laparoscopic cholecystectomy, is surgery to remove the gallbladder using small incisions. Tell your health care provider about all the medical conditions you have and all the medicines you are taking for those conditions. Before the procedure, follow  instructions about when to stop eating and drinking and changing or stopping medicines. Plan to have a responsible adult care for you for the time you are told after you leave the hospital or clinic. This information is not intended to replace advice given to you by your health care provider. Make sure you discuss any questions you have with your health care provider. Document Revised: 11/30/2020 Document Reviewed: 11/30/2020 Elsevier Patient Education  2023 [employer]. I will call you later today or tomorrow to let you know when the Cholangiogram is scheduled.

## 2022-05-23 ENCOUNTER — Telehealth: Payer: Self-pay | Admitting: Surgery

## 2022-05-23 ENCOUNTER — Other Ambulatory Visit: Payer: Self-pay | Admitting: Interventional Radiology

## 2022-05-23 ENCOUNTER — Ambulatory Visit
Admission: RE | Admit: 2022-05-23 | Discharge: 2022-05-23 | Disposition: A | Discharge status: Home / Self Care | Payer: Medicare HMO | Source: Ambulatory Visit | Attending: Surgery | Admitting: Surgery

## 2022-05-23 DIAGNOSIS — Z434 Encounter for attention to other artificial openings of digestive tract: Secondary | ICD-10-CM | POA: Insufficient documentation

## 2022-05-23 DIAGNOSIS — K810 Acute cholecystitis: Secondary | ICD-10-CM

## 2022-05-23 HISTORY — PX: IR EXCHANGE BILIARY DRAIN: IMG6046

## 2022-05-23 MED ORDER — SODIUM CHLORIDE 0.9% FLUSH: 5.0000 mL | Freq: Every day | INTRAVENOUS | Status: DC

## 2022-05-23 MED ORDER — LIDOCAINE HCL 1 % IJ SOLN
INTRAMUSCULAR | Status: AC
  Administered 2022-05-23: 10 mL
  Filled 2022-05-23: qty 20

## 2022-05-23 MED ORDER — IOHEXOL 300 MG/ML  SOLN: 4.0000 mL | Freq: Once | INTRAMUSCULAR | Status: DC | PRN

## 2022-05-23 NOTE — Telephone Encounter (Signed)
Spoke with wife, Darel Hong, they are informed of Pre-Admission date/time, and Surgery date at [facility].  Surgery Date: 06/13/22 Preadmission Testing Date: 06/02/22 (phone 8a-1p)  Patient has been made aware to call [phone], between 1-3:00pm the day before surgery, to find out what time to arrive for surgery.

## 2022-05-23 NOTE — Procedures (Signed)
Vascular and Interventional Radiology Procedure Note  Patient: Joel Spencer DOB: 06-16-1935 Medical Record Number: [id number] Note Date/Time: 05/23/22 12:15 PM   Performing Physician: Roanna Banning, MD Assistant(s): None  Diagnosis: Hx of emphysematous cholecystitis. Chole drain placed 03/29/22. Follow up.  Procedure:  CHOLECYSTOSTOMY TUBE EXCHANGE  Anesthesia: Local Anesthetic Complications: None Estimated Blood Loss:  0 mL Specimens:  None  Findings:  Attempt at anterograde cholangiogram with contracted GB around the tube. No opacification of the cystic or common bile ducts. Successful exchange of a 21F cholecystostomy tube.  Plan: Flush tube w 5 mL sterile NS and record drain output qShift. Follow up for routine tube evaluation in 8 week(s).   See detailed procedure note with images in PACS. The patient tolerated the procedure well without incident or complication and was returned to Recovery in stable condition.    Roanna Banning, MD [facility]   Pager. [phone] Clinic. [phone]

## 2022-05-24 ENCOUNTER — Ambulatory Visit: Payer: Medicare HMO | Admitting: Surgery

## 2022-05-29 ENCOUNTER — Other Ambulatory Visit: Payer: Self-pay | Admitting: Surgery

## 2022-05-29 NOTE — Telephone Encounter (Signed)
Spoke with patient 's daughter in law Toniann Fail and she said the Cholecystostomy tube is stopped up again. I spoke with Marylu Lund and placed order to have tube checked.   Patient notified of appointment at 10:30 on 05/29/22.

## 2022-05-30 ENCOUNTER — Other Ambulatory Visit: Payer: Self-pay | Admitting: Surgery

## 2022-05-30 ENCOUNTER — Ambulatory Visit
Admission: RE | Admit: 2022-05-30 | Discharge: 2022-05-30 | Disposition: A | Discharge status: Home / Self Care | Payer: Medicare HMO | Source: Ambulatory Visit | Attending: Surgery | Admitting: Surgery

## 2022-05-30 DIAGNOSIS — K810 Acute cholecystitis: Secondary | ICD-10-CM

## 2022-05-30 DIAGNOSIS — Z434 Encounter for attention to other artificial openings of digestive tract: Secondary | ICD-10-CM | POA: Diagnosis present

## 2022-05-30 HISTORY — PX: IR EXCHANGE BILIARY DRAIN: IMG6046

## 2022-05-30 MED ORDER — LIDOCAINE HCL 1 % IJ SOLN
INTRAMUSCULAR | Status: AC
  Administered 2022-05-30: 3 mL
  Filled 2022-05-30: qty 20

## 2022-05-30 MED ORDER — IOHEXOL 300 MG/ML  SOLN
25.0000 mL | Freq: Once | INTRAMUSCULAR | Status: AC | PRN
  Administered 2022-05-30: 3 mL

## 2022-05-30 NOTE — Procedures (Signed)
Interventional Radiology Procedure Note   History: 86 yo male with perc chole.  His complaint that flushing the drains leads to leakage at the skin.   Procedure: Image guided drain replacement, perc chole.  83F pigtail drain.  Findings: The GB is completely contracted/scarred.  Contrast refluxes.  No cystic duct opacification  Complications: None  Recommendations: - Routine drain care.  Stop sterile flushes. Record output.  - Scheduled with surgery in January - routine drain exchanges if needed if surgery does not occur  Signed,  Yvone Neu. Loreta Ave, DO

## 2022-06-02 ENCOUNTER — Encounter: Payer: Self-pay | Admitting: Surgery

## 2022-06-02 ENCOUNTER — Encounter: Payer: Self-pay | Admitting: Radiology

## 2022-06-02 ENCOUNTER — Encounter: Payer: Self-pay | Admitting: Urgent Care

## 2022-06-02 ENCOUNTER — Other Ambulatory Visit: Payer: Self-pay

## 2022-06-02 ENCOUNTER — Encounter
Admission: RE | Admit: 2022-06-02 | Discharge: 2022-06-02 | Disposition: A | Discharge status: Home / Self Care | Payer: Medicare HMO | Source: Ambulatory Visit | Attending: Surgery | Admitting: Surgery

## 2022-06-02 HISTORY — DX: Anemia, unspecified: D64.9

## 2022-06-02 NOTE — Progress Notes (Signed)
Perioperative Services  Pre-Admission/Anesthesia Testing Clinical Review  Date: 06/02/22  Patient Demographics:  Name: Joel Spencer DOB:   06-08-1936 MRN:   [id number]  Planned Surgical Procedure(s):    Case: [id number] Date/Time: 06/13/22 0944   Procedures:      XI ROBOTIC ASSISTED LAPAROSCOPIC CHOLECYSTECTOMY     INDOCYANINE GREEN FLUORESCENCE IMAGING (ICG)   Anesthesia type: General   Pre-op diagnosis: acute cholecystitis   Location: [facility] OR ROOM 04 / [place] [facility]   Surgeons: Olean Ree, MD   NOTE: Available PAT nursing documentation and vital signs have been reviewed. Clinical nursing staff has updated patient's PMH/PSHx, current medication list, and drug allergies/intolerances to ensure comprehensive history available to assist in medical decision making as it pertains to the aforementioned surgical procedure and anticipated anesthetic course. Extensive review of available clinical information performed. Joel Spencer PMH and PSHx updated with any diagnoses/procedures that  may have been inadvertently omitted during his intake with the pre-admission testing department's nursing staff.  Clinical Discussion:  Joel Spencer is a 86 y.o. male who is submitted for pre-surgical anesthesia review and clearance prior to him undergoing the above procedure. Patient has never been a smoker. Pertinent PMH includes: CAD, dilated cardiomyopathy (s/p AICD placement), chronic HFrEF, LBBB, BILATERAL carotid artery stenosis, angina, HTN, HLD, hypothyroidism, anemia, OA.  Patient is followed by cardiology Nehemiah Massed, MD). He was last seen in the cardiology clinic on 05/09/2022; notes reviewed. At the time of his clinic visit, the patient denied any chest pain, shortness of breath, PND, orthopnea, palpitations, significant peripheral edema, vertiginous symptoms, or presyncope/syncope.  Patient with a PMH significant for cardiovascular diagnoses.  Patient underwent diagnostic LEFT  heart catheterization on 03/31/2016 revealing multivessel CAD; 50% proximal mid LCx, 30% distal LCx, 45% D1-1, 50% D1-2, 40% mid LAD, 25% distal LAD, and 30% proximal to mid RCA.  Given the nonobstructive nature of his coronary artery disease, there was no intervention indicated at that time.  Recommendations were for continued medical management.  Patient developed a dilated cardiomyopathy and 04/2016, at which time his EF declined to 30%.  Patient ultimately underwent placement of a Medtronic Claria CRT-D device on 03/12/2019, device is regularly interrogated by his primary cardiology/electrophysiology team.  Recent interrogation revealed a properly/normally functioning device.  Myocardial perfusion imaging study performed on 05/07/2018 revealing a low normal left ventricular systolic function with an EF of 51%.  There was no evidence of stress-induced myocardial ischemia or arrhythmia; no scintigraphic evidence of scar.  Study determined to be intermediate risk due to baseline ECG changes (LBBB).  Most recent TTE was performed on 09/09/2019 revealing a moderately reduced left ventricular systolic function with mild concentric LVH; LVEF decreased at 35%.  There was moderate global hypokinesis. Left ventricular diastolic Doppler parameters consistent with abnormal relaxation (G1DD).  Left atrium and right ventricle mildly enlarged.  There was trivial pulmonary, mild tricuspid/aortic, and moderate mitral valve regurgitation.  All transvalvular gradients were noted to be normal with no evidence suggestive of valvular stenosis.  Blood pressure well-controlled at 94/60 mmHg on no pharmacological interventions.  Patient is intolerant of statins.  Beyond diet and lifestyle modification, patient is not on any type of lipid-lowering therapies for his HLD diagnosis and ASCVD prevention.  Patient has not diabetic. Patient does not have an OSAH diagnosis.  Patient making efforts to maintain an active lifestyle by  daily walking. Functional capacity, as defined by DASI, is documented as being >/= 4 METS.  No changes were made to his  medication regimen during his visit with cardiology.  Patient to follow-up with outpatient cardiology team in 6 months or sooner if needed.  Joel Spencer is scheduled for a XI ROBOTIC ASSISTED LAPAROSCOPIC CHOLECYSTECTOMY 06/13/2022 with Dr. Ardath Sax, MD. Given patient's past medical history significant for cardiovascular diagnoses, presurgical cardiac clearance was sought by the PAT team. Per cardiology, "the patient is at the lowest risk possible for perioperative cardiovascular complications with the planned procedure.  The overall risk his procedure is low (<1%).  Currently has no evidence active and/or significant angina and/or congestive heart failure. Patient may proceed to surgery without restriction or need for further cardiovascular testing at an overall LOW risk". In review of his medication reconciliation, the patient is not noted to be taking any type of anticoagulation or antiplatelet therapies that would need to be held during his perioperative course.  Patient denies previous perioperative complications with anesthesia in the past. In review of the available records, it is noted that patient underwent a MAC anesthetic course at [facility] ([facility]) in 02/2019 without documented complications.      05/22/2022    2:34 PM 04/21/2022    8:59 AM 04/01/2022   10:00 PM  Vitals with BMI  Height _0  _1    Weight 158 lbs 156 lbs 6 oz   BMI 44.81 85.63   Systolic 149 702 637  Diastolic 84 77 72  Pulse 91 92 69    Providers/Specialists:   NOTE: Primary physician provider listed below. Patient may have been seen by APP or partner within same practice.   PROVIDER ROLE / SPECIALTY LAST Delight Stare, MD General Surgery (Surgeon) 05/22/2021  Tracie Harrier, MD Primary Care Provider 04/05/2022  Serafina Royals, MD Cardiology  05/09/2022   Allergies:  Statins  Current Home Medications:   No current facility-administered medications for this encounter.    acetaminophen (TYLENOL) 500 MG tablet   Ca Carbonate-Mag Hydroxide (ROLAIDS PO)   cholecalciferol (VITAMIN D3) 25 MCG (1000 UNIT) tablet   docusate sodium (COLACE) 100 MG capsule   ferrous sulfate 325 (65 FE) MG EC tablet   polyethylene glycol powder (GLYCOLAX/MIRALAX) 17 GM/SCOOP powder   sodium chloride flush (BD POSIFLUSH) 0.9 % SOLN injection   History:   Past Medical History:  Diagnosis Date   AICD (automatic cardioverter/defibrillator) present 03/12/2019   a.) Medtronic Claria MRI CRT-D   Anemia    Anginal pain (HCC)    Arthritis    Bilateral carotid artery stenosis    Chronic HFrEF (heart failure with reduced ejection fraction) ([place])    a.) TTE 04/21/2016: EF 30%, mod glob HL, mod LAE, triv PR, mild AR/TR, sever MR, G1DD; b.) TTE 05/07/2018: EF 35%, mild LVH, mod glob HK, LAE, RVE, triv PR, mild AR/TR, mod MR; c.) TTE 09/09/2019: ED 35%, LVH, glob HK, BAE, RVE, triv PR, mild AR/TR, mod MR, G1DD.   Coronary artery disease 03/31/2016   a.) LHC 03/31/2016: 50% p-mLCx, 30% dLCx, 45% D1-1, 50% D1-2, 40% mLAD, 25% dLAD, 30% p-mRCA - med mgmt.   Dilated cardiomyopathy ([place])    a.) LHC 03/31/2016: EF 55%; b.) TTE 04/21/2016: EF 30%; c.) TTE 05/07/2018: EF 35%; d.)TTE 09/09/2019: ED 35%   History of 2019 novel coronavirus disease (COVID-19) 01/23/2020   Hyperlipemia    Hypertension    Hypothyroidism    LBBB (left bundle branch block)    Vitamin D deficiency    Past Surgical History:  Procedure Laterality Date   CARDIAC  CATHETERIZATION Left 03/31/2016   Procedure: Left Heart Cath and Coronary Angiography;  Surgeon: Corey Skains, MD;  Location: [place] [facility];  Service: Cardiovascular;  Laterality: Left;   CARDIAC DEFIBRILLATOR PLACEMENT  03/12/2019   Procedure: CARDIAC DEFIBRILLATOR PLACEMENT; Location: [facility]; Surgeon: Mylinda Latina, MD    COLONOSCOPY     x 2   ESOPHAGOGASTRODUODENOSCOPY (EGD) WITH PROPOFOL N/A 02/26/2018   Procedure: ESOPHAGOGASTRODUODENOSCOPY (EGD) WITH PROPOFOL;  Surgeon: Toledo, Benay Pike, MD;  Location: [facility] ENDOSCOPY;  Service: Gastroenterology;  Laterality: N/A;   IR CHOLANGIOGRAM EXISTING TUBE  04/25/2022   IR EXCHANGE BILIARY DRAIN  05/23/2022   IR EXCHANGE BILIARY DRAIN  05/30/2022   IR PERC CHOLECYSTOSTOMY  03/29/2022   IR RADIOLOGIST EVAL & MGMT  04/25/2022   Family History  Problem Relation Age of Onset   Bone cancer Mother    Diabetes Father    Social History   Tobacco Use   Smoking status: Never   Smokeless tobacco: Never  Substance Use Topics   Alcohol use: No   Drug use: No    Pertinent Clinical Results:  LABS: Labs reviewed: Acceptable for surgery.  No visits with results within 3 Day(s) from this visit.  Latest known visit with results is:  Admission on 04/01/2022, Discharged on 04/01/2022  Component Date Value Ref Range Status   Sodium 04/01/2022 136  135 - 145 mmol/L Final   Potassium 04/01/2022 3.5  3.5 - 5.1 mmol/L Final   Chloride 04/01/2022 106  98 - 111 mmol/L Final   CO2 04/01/2022 23  22 - 32 mmol/L Final   Glucose, Bld 04/01/2022 147 (H)  70 - 99 mg/dL Final   Glucose reference range applies only to samples taken after fasting for at least 8 hours.   BUN 04/01/2022 20  8 - 23 mg/dL Final   Creatinine, Ser 04/01/2022 0.77  0.61 - 1.24 mg/dL Final   Calcium 04/01/2022 8.5 (L)  8.9 - 10.3 mg/dL Final   Total Protein 04/01/2022 6.8  6.5 - 8.1 g/dL Final   Albumin 04/01/2022 3.1 (L)  3.5 - 5.0 g/dL Final   AST 04/01/2022 47 (H)  15 - 41 U/L Final   ALT 04/01/2022 81 (H)  0 - 44 U/L Final   Alkaline Phosphatase 04/01/2022 80  38 - 126 U/L Final   Total Bilirubin 04/01/2022 1.0  0.3 - 1.2 mg/dL Final   GFR, Estimated 04/01/2022 >60  >60 mL/min Final   Comment: (NOTE) Calculated using the CKD-EPI Creatinine Equation (2021)    Anion gap 04/01/2022 7  5 - 15 Final    Performed at [facility], [place], [place] [address]   WBC 04/01/2022 6.7  4.0 - 10.5 K/uL Final   RBC 04/01/2022 4.57  4.22 - 5.81 MIL/uL Final   Hemoglobin 04/01/2022 13.5  13.0 - 17.0 g/dL Final   HCT 04/01/2022 39.3  39.0 - 52.0 % Final   MCV 04/01/2022 86.0  80.0 - 100.0 fL Final   MCH 04/01/2022 29.5  26.0 - 34.0 pg Final   MCHC 04/01/2022 34.4  30.0 - 36.0 g/dL Final   RDW 04/01/2022 13.1  11.5 - 15.5 % Final   Platelets 04/01/2022 226  150 - 400 K/uL Final   nRBC 04/01/2022 0.0  0.0 - 0.2 % Final   Neutrophils Relative % 04/01/2022 70  % Final   Neutro Abs 04/01/2022 4.7  1.7 - 7.7 K/uL Final   Lymphocytes Relative 04/01/2022 14  % Final  Lymphs Abs 04/01/2022 1.0  0.7 - 4.0 K/uL Final   Monocytes Relative 04/01/2022 11  % Final   Monocytes Absolute 04/01/2022 0.7  0.1 - 1.0 K/uL Final   Eosinophils Relative 04/01/2022 4  % Final   Eosinophils Absolute 04/01/2022 0.3  0.0 - 0.5 K/uL Final   Basophils Relative 04/01/2022 0  % Final   Basophils Absolute 04/01/2022 0.0  0.0 - 0.1 K/uL Final   Immature Granulocytes 04/01/2022 1  % Final   Abs Immature Granulocytes 04/01/2022 0.04  0.00 - 0.07 K/uL Final   Performed at [facility], [address] [place], [place] [address]    ECG: Date: 05/09/2022 Time ECG obtained: 1348 PM Rate: 89 bpm Rhythm:  Atrial sensed ventricular paced rhythm Axis (leads I and aVF): Normal Intervals: PR 154 ms. QRS 134 ms. QTc 468 ms. ST segment and T wave changes: No evidence of acute ST segment elevation or depression Comparison: Similar to previous tracing obtained on 04/01/2022   IMAGING / PROCEDURES: CT CHEST ABDOMEN PELVIS WO CONTRAST performed on 04/01/2022 No evidence of pneumomediastinum, esophageal perforation, or mediastinal abscess. Small right pleural effusion with atelectasis in the right lung base. Interval placement of cholecystostomy tube with decompression of the gallbladder. Residual  edema and inflammatory infiltration around the gallbladder. No focal abscess. Aortic atherosclerosis.  TRANSTHORACIC ECHOCARDIOGRAM performed on 09/09/2019 Moderately reduced left ventricular systolic function with mild LVH; LVEF 35% Moderate global hypokinesis Left ventricular diastolic Doppler parameters consistent with abnormal relaxation (G1DD). Left atrium mildly enlarged Right ventricle mildly enlarged Trivial PR  Mild AR and TR  Moderate MR  Normal transvalvular gradients; no valvular stenosis No pericardial effusion  LEFT HEART CATHETERIZATION AND CORONARY ANGIOGRAPHY performed on 03/31/2016 Normal left ventricular systolic function with an EF of 55% Multivessel CAD 50% proximal to mid LCx 30% distal LCx 45% D1-1 50% D1-2 40% mid LAD 25% distal LAD 30% proximal and mid RCA Recommendations No significant stenosis in the coronaries.  Continue medical management of CAD risk factors.     Impression and Plan:  FAREED FUNG has been referred for pre-anesthesia review and clearance prior to him undergoing the planned anesthetic and procedural courses. Available labs, pertinent testing, and imaging results were personally reviewed by me. This patient has been appropriately cleared by cardiology with an overall LOW risk of significant perioperative cardiovascular complications. Recommendations regarding AICD have been solicited from patient's cardiology/electrophysiology team. In my absence, the PAT staff have been directed to place completed documentation/form with the patient's OR chart when received. With the location of the procedure, there is increased risk that the procedure stands to have effects on the device, therefore standard magnet use and normal perioperative monitoring expected.   Based on clinical review performed today (06/02/22), barring any significant acute changes in the patient's overall condition, it is anticipated that he will be able to proceed with the  planned surgical intervention. Any acute changes in clinical condition may necessitate his procedure being postponed and/or cancelled. Patient will meet with anesthesia team (MD and/or CRNA) on the day of his procedure for preoperative evaluation/assessment. Questions regarding anesthetic course will be fielded at that time.   Pre-surgical instructions were reviewed with the patient during his PAT appointment and questions were fielded by PAT clinical staff. Patient was advised that if any questions or concerns arise prior to his procedure then he should return a call to PAT and/or his surgeon's office to discuss.  Honor Loh, MSN, APRN, FNP-C, CEN [facility]  Peri-operative Services Nurse Practitioner Phone: [phone] Fax: [phone] 06/02/22 5:11 PM  NOTE: This note has been prepared using Dragon dictation software. Despite my best ability to proofread, there is always the potential that unintentional transcriptional errors may still occur from this process.

## 2022-06-02 NOTE — Patient Instructions (Addendum)
Your procedure is scheduled on: 06/13/21 - Tuesday Report to the Registration Desk on the 1st floor of the Medical Mall. To find out your arrival time, please call [phone] between 1PM - 3PM on: 06/09/22 - Friday If your arrival time is 6:00 am, do not arrive prior to that time as the Medical Mall entrance doors do not open until 6:00 am.  REMEMBER: Instructions that are not followed completely may result in serious medical risk, up to and including death; or upon the discretion of your surgeon and anesthesiologist your surgery may need to be rescheduled.  Do not eat food after midnight the night before surgery.  No gum chewing, lozengers or hard candies.  You may however, drink CLEAR liquids up to 2 hours before you are scheduled to arrive for your surgery. Do not drink anything within 2 hours of your scheduled arrival time.  Clear liquids include: - water  - apple juice without pulp - gatorade (not RED colors) - black coffee or tea (Do NOT add milk or creamers to the coffee or tea) Do NOT drink anything that is not on this list.  TAKE THESE MEDICATIONS THE MORNING OF SURGERY WITH A SIP OF WATER: NONE  One week prior to surgery: Stop Anti-inflammatories (NSAIDS) such as Advil, Aleve, Ibuprofen, Motrin, Naproxen, Naprosyn and Aspirin based products such as Excedrin, Goodys Powder, BC Powder.  Stop ANY OVER THE COUNTER supplements until after surgery. cholecalciferol (VITAMIN D3)   [phone]   You may however, continue to take Tylenol if needed for pain up until the day of surgery.  No Alcohol for 24 hours before or after surgery.  No Smoking including e-cigarettes for 24 hours prior to surgery.  No chewable tobacco products for at least 6 hours prior to surgery.  No nicotine patches on the day of surgery.  Do not use any "recreational" drugs for at least a week prior to your surgery.  Please be advised that the combination of cocaine and anesthesia may have negative  outcomes, up to and including death. If you test positive for cocaine, your surgery will be cancelled.  On the morning of surgery brush your teeth with toothpaste and water, you may rinse your mouth with mouthwash if you wish. Do not swallow any toothpaste or mouthwash.  Use CHG Soap or wipes as directed on instruction sheet.  Do not wear jewelry, make-up, hairpins, clips or nail polish.  Do not wear lotions, powders, or perfumes.   Do not shave body from the neck down 48 hours prior to surgery just in case you cut yourself which could leave a site for infection.  Also, freshly shaved skin may become irritated if using the CHG soap.  Contact lenses, hearing aids and dentures may not be worn into surgery.  Do not bring valuables to the hospital. [facility] is not responsible for any missing/lost belongings or valuables.   Notify your doctor if there is any change in your medical condition (cold, fever, infection).  Wear comfortable clothing (specific to your surgery type) to the hospital.  After surgery, you can help prevent lung complications by doing breathing exercises.  Take deep breaths and cough every 1-2 hours. Your doctor may order a device called an Incentive Spirometer to help you take deep breaths. When coughing or sneezing, hold a pillow firmly against your incision with both hands. This is called "splinting." Doing this helps protect your incision. It also decreases belly discomfort.  If you are being admitted to the hospital  overnight, leave your suitcase in the car. After surgery it may be brought to your room.  If you are being discharged the day of surgery, you will not be allowed to drive home. You will need a responsible adult (18 years or older) to drive you home and stay with you that night.   If you are taking public transportation, you will need to have a responsible adult (18 years or older) with you. Please confirm with your physician that it is acceptable to  use public transportation.   Please call the Pre-admissions Testing Dept. at [phone] if you have any questions about these instructions.  Surgery Visitation Policy:  Patients undergoing a surgery or procedure may have two family members or support persons with them as long as the person is not COVID-19 positive or experiencing its symptoms.   Inpatient Visitation:    Visiting hours are 7 a.m. to 8 p.m. Up to four visitors are allowed at one time in a patient room. The visitors may rotate out with other people during the day. One designated support person (adult) may remain overnight.  Due to an increase in RSV and influenza rates and associated hospitalizations, children ages 6 and under will not be able to visit patients in [facility]. Masks continue to be strongly recommended.

## 2022-06-02 NOTE — Pre-Procedure Instructions (Signed)
PAT pre op interview ans instructions completed with patients wife. Patient is [facility], DPR signed on 04/21/22 appointed all [facility] to release information to Jjesus Dingley wife [phone] .

## 2022-06-07 NOTE — Progress Notes (Addendum)
Pacemaker/ICD form received from Dr. Darrold Junker and placed on the surgical chart.

## 2022-06-12 MED ORDER — LACTATED RINGERS IV SOLN: INTRAVENOUS | Status: DC

## 2022-06-12 MED ORDER — SODIUM CHLORIDE 0.9 % IV SOLN: INTRAVENOUS | Status: DC

## 2022-06-12 MED ORDER — CHLORHEXIDINE GLUCONATE CLOTH 2 % EX PADS: 6.0000 | MEDICATED_PAD | Freq: Once | CUTANEOUS | Status: DC

## 2022-06-12 MED ORDER — SODIUM CHLORIDE 0.9 % IV SOLN
2.0000 g | INTRAVENOUS | Status: AC
  Administered 2022-06-13: 2 g via INTRAVENOUS

## 2022-06-12 MED ORDER — GABAPENTIN 100 MG PO CAPS: 200.0000 mg | ORAL_CAPSULE | ORAL | Status: AC

## 2022-06-12 MED ORDER — SODIUM CHLORIDE 0.9 % IV SOLN
2.0000 g | INTRAVENOUS | Status: DC
  Filled 2022-06-12: qty 20

## 2022-06-12 MED ORDER — BUPIVACAINE LIPOSOME 1.3 % IJ SUSP: 20.0000 mL | Freq: Once | INTRAMUSCULAR | Status: DC

## 2022-06-12 MED ORDER — ACETAMINOPHEN 500 MG PO TABS: 1000.0000 mg | ORAL_TABLET | ORAL | Status: AC

## 2022-06-12 MED ORDER — CHLORHEXIDINE GLUCONATE CLOTH 2 % EX PADS
6.0000 | MEDICATED_PAD | Freq: Once | CUTANEOUS | Status: AC
  Administered 2022-06-13: 6 via TOPICAL

## 2022-06-12 MED ORDER — FAMOTIDINE 20 MG PO TABS: 20.0000 mg | ORAL_TABLET | Freq: Once | ORAL | Status: AC

## 2022-06-12 MED ORDER — ORAL CARE MOUTH RINSE: 15.0000 mL | Freq: Once | OROMUCOSAL | Status: AC

## 2022-06-12 MED ORDER — CHLORHEXIDINE GLUCONATE 0.12 % MT SOLN: 15.0000 mL | Freq: Once | OROMUCOSAL | Status: AC

## 2022-06-12 MED ORDER — INDOCYANINE GREEN 25 MG IV SOLR
2.5000 mg | INTRAVENOUS | Status: AC
  Administered 2022-06-13: 2.5 mg via INTRAVENOUS
  Filled 2022-06-12: qty 1

## 2022-06-13 ENCOUNTER — Other Ambulatory Visit: Payer: Self-pay

## 2022-06-13 ENCOUNTER — Encounter: Payer: Self-pay | Admitting: Surgery

## 2022-06-13 ENCOUNTER — Ambulatory Visit: Payer: Medicare HMO | Admitting: Urgent Care

## 2022-06-13 ENCOUNTER — Ambulatory Visit
Admission: RE | Admit: 2022-06-13 | Discharge: 2022-06-13 | Disposition: A | Discharge status: Home / Self Care | Payer: Medicare HMO | Attending: Surgery | Admitting: Surgery

## 2022-06-13 ENCOUNTER — Encounter
Admission: RE | Disposition: A | Discharge status: Home / Self Care | Payer: Self-pay | Source: Home / Self Care | Attending: Surgery

## 2022-06-13 DIAGNOSIS — I5022 Chronic systolic (congestive) heart failure: Secondary | ICD-10-CM | POA: Insufficient documentation

## 2022-06-13 DIAGNOSIS — I6523 Occlusion and stenosis of bilateral carotid arteries: Secondary | ICD-10-CM | POA: Insufficient documentation

## 2022-06-13 DIAGNOSIS — K8000 Calculus of gallbladder with acute cholecystitis without obstruction: Secondary | ICD-10-CM | POA: Insufficient documentation

## 2022-06-13 DIAGNOSIS — Z9049 Acquired absence of other specified parts of digestive tract: Secondary | ICD-10-CM | POA: Insufficient documentation

## 2022-06-13 DIAGNOSIS — Z8616 Personal history of COVID-19: Secondary | ICD-10-CM | POA: Insufficient documentation

## 2022-06-13 DIAGNOSIS — E039 Hypothyroidism, unspecified: Secondary | ICD-10-CM | POA: Diagnosis not present

## 2022-06-13 DIAGNOSIS — I2510 Atherosclerotic heart disease of native coronary artery without angina pectoris: Secondary | ICD-10-CM | POA: Insufficient documentation

## 2022-06-13 DIAGNOSIS — Z95810 Presence of automatic (implantable) cardiac defibrillator: Secondary | ICD-10-CM | POA: Diagnosis not present

## 2022-06-13 DIAGNOSIS — I110 Hypertensive heart disease with heart failure: Secondary | ICD-10-CM | POA: Diagnosis not present

## 2022-06-13 DIAGNOSIS — I420 Dilated cardiomyopathy: Secondary | ICD-10-CM | POA: Diagnosis not present

## 2022-06-13 DIAGNOSIS — K810 Acute cholecystitis: Secondary | ICD-10-CM | POA: Diagnosis not present

## 2022-06-13 DIAGNOSIS — I447 Left bundle-branch block, unspecified: Secondary | ICD-10-CM | POA: Insufficient documentation

## 2022-06-13 DIAGNOSIS — E785 Hyperlipidemia, unspecified: Secondary | ICD-10-CM | POA: Diagnosis not present

## 2022-06-13 DIAGNOSIS — K819 Cholecystitis, unspecified: Secondary | ICD-10-CM

## 2022-06-13 HISTORY — DX: Chronic systolic (congestive) heart failure: I50.22

## 2022-06-13 SURGERY — CHOLECYSTECTOMY, ROBOT-ASSISTED, LAPAROSCOPIC
Anesthesia: General

## 2022-06-13 MED ORDER — FENTANYL CITRATE (PF) 100 MCG/2ML IJ SOLN
25.0000 ug | INTRAMUSCULAR | Status: DC | PRN
  Administered 2022-06-13: 25 ug via INTRAVENOUS

## 2022-06-13 MED ORDER — FENTANYL CITRATE (PF) 100 MCG/2ML IJ SOLN
INTRAMUSCULAR | Status: AC
  Filled 2022-06-13: qty 2

## 2022-06-13 MED ORDER — ACETAMINOPHEN 500 MG PO TABS
ORAL_TABLET | ORAL | Status: AC
  Administered 2022-06-13: 1000 mg via ORAL
  Filled 2022-06-13: qty 2

## 2022-06-13 MED ORDER — OXYCODONE HCL 5 MG PO TABS
ORAL_TABLET | ORAL | Status: AC
  Administered 2022-06-13: 5 mg via ORAL
  Filled 2022-06-13: qty 1

## 2022-06-13 MED ORDER — PHENYLEPHRINE HCL-NACL 20-0.9 MG/250ML-% IV SOLN
INTRAVENOUS | Status: AC
  Filled 2022-06-13: qty 250

## 2022-06-13 MED ORDER — FENTANYL CITRATE (PF) 100 MCG/2ML IJ SOLN
INTRAMUSCULAR | Status: DC | PRN
  Administered 2022-06-13: 50 ug via INTRAVENOUS
  Administered 2022-06-13 (×5): 25 ug via INTRAVENOUS
  Administered 2022-06-13: 100 ug via INTRAVENOUS
  Administered 2022-06-13: 25 ug via INTRAVENOUS

## 2022-06-13 MED ORDER — EPINEPHRINE PF 1 MG/ML IJ SOLN
INTRAMUSCULAR | Status: AC
  Filled 2022-06-13: qty 1

## 2022-06-13 MED ORDER — OXYCODONE HCL 5 MG PO TABS: 5.0000 mg | ORAL_TABLET | Freq: Once | ORAL | Status: AC

## 2022-06-13 MED ORDER — PHENYLEPHRINE HCL (PRESSORS) 10 MG/ML IV SOLN
INTRAVENOUS | Status: AC
  Filled 2022-06-13: qty 1

## 2022-06-13 MED ORDER — OXYCODONE HCL 5 MG PO TABS
ORAL_TABLET | ORAL | Status: AC
  Administered 2022-06-13: 5 mg
  Filled 2022-06-13: qty 1

## 2022-06-13 MED ORDER — SODIUM CHLORIDE 0.9 % IR SOLN
Status: DC | PRN
  Administered 2022-06-13: 200 mL

## 2022-06-13 MED ORDER — ONDANSETRON HCL 4 MG/2ML IJ SOLN
INTRAMUSCULAR | Status: DC | PRN
  Administered 2022-06-13: 4 mg via INTRAVENOUS

## 2022-06-13 MED ORDER — SUGAMMADEX SODIUM 200 MG/2ML IV SOLN
INTRAVENOUS | Status: DC | PRN
  Administered 2022-06-13 (×2): 100 mg via INTRAVENOUS

## 2022-06-13 MED ORDER — ACETAMINOPHEN 500 MG PO TABS: 1000.0000 mg | ORAL_TABLET | Freq: Four times a day (QID) | ORAL | Status: AC | PRN

## 2022-06-13 MED ORDER — LIDOCAINE HCL (CARDIAC) PF 100 MG/5ML IV SOSY
PREFILLED_SYRINGE | INTRAVENOUS | Status: DC | PRN
  Administered 2022-06-13: 50 mg via INTRAVENOUS

## 2022-06-13 MED ORDER — ONDANSETRON HCL 4 MG/2ML IJ SOLN: 4.0000 mg | Freq: Once | INTRAMUSCULAR | Status: DC | PRN

## 2022-06-13 MED ORDER — OXYCODONE HCL 5 MG PO TABS: 5.0000 mg | ORAL_TABLET | ORAL | 0 refills | Status: DC | PRN

## 2022-06-13 MED ORDER — FAMOTIDINE 20 MG PO TABS
ORAL_TABLET | ORAL | Status: AC
  Administered 2022-06-13: 20 mg via ORAL
  Filled 2022-06-13: qty 1

## 2022-06-13 MED ORDER — ESMOLOL HCL 100 MG/10ML IV SOLN
INTRAVENOUS | Status: DC | PRN
  Administered 2022-06-13: 10 mg via INTRAVENOUS
  Administered 2022-06-13: 5 mg via INTRAVENOUS
  Administered 2022-06-13: 10 mg via INTRAVENOUS

## 2022-06-13 MED ORDER — FENTANYL CITRATE (PF) 100 MCG/2ML IJ SOLN
INTRAMUSCULAR | Status: AC
  Administered 2022-06-13: 25 ug via INTRAVENOUS
  Filled 2022-06-13: qty 2

## 2022-06-13 MED ORDER — 0.9 % SODIUM CHLORIDE (POUR BTL) OPTIME
TOPICAL | Status: DC | PRN
  Administered 2022-06-13: 100 mL

## 2022-06-13 MED ORDER — DEXMEDETOMIDINE HCL IN NACL 80 MCG/20ML IV SOLN
INTRAVENOUS | Status: DC | PRN
  Administered 2022-06-13 (×2): 4 ug via BUCCAL

## 2022-06-13 MED ORDER — EPHEDRINE SULFATE (PRESSORS) 50 MG/ML IJ SOLN
INTRAMUSCULAR | Status: DC | PRN
  Administered 2022-06-13: 10 mg via INTRAVENOUS

## 2022-06-13 MED ORDER — MIDAZOLAM HCL 2 MG/2ML IJ SOLN
INTRAMUSCULAR | Status: AC
  Filled 2022-06-13: qty 2

## 2022-06-13 MED ORDER — PHENYLEPHRINE 80 MCG/ML (10ML) SYRINGE FOR IV PUSH (FOR BLOOD PRESSURE SUPPORT)
PREFILLED_SYRINGE | INTRAVENOUS | Status: DC | PRN
  Administered 2022-06-13: 120 ug via INTRAVENOUS
  Administered 2022-06-13: 80 ug via INTRAVENOUS

## 2022-06-13 MED ORDER — DEXAMETHASONE SODIUM PHOSPHATE 10 MG/ML IJ SOLN
INTRAMUSCULAR | Status: DC | PRN
  Administered 2022-06-13: 10 mg via INTRAVENOUS

## 2022-06-13 MED ORDER — GABAPENTIN 300 MG PO CAPS
ORAL_CAPSULE | ORAL | Status: AC
  Administered 2022-06-13: 200 mg via ORAL
  Filled 2022-06-13: qty 1

## 2022-06-13 MED ORDER — GABAPENTIN 100 MG PO CAPS
ORAL_CAPSULE | ORAL | Status: AC
  Filled 2022-06-13: qty 2

## 2022-06-13 MED ORDER — PROPOFOL 10 MG/ML IV BOLUS
INTRAVENOUS | Status: DC | PRN
  Administered 2022-06-13 (×2): 20 mg via INTRAVENOUS
  Administered 2022-06-13: 100 mg via INTRAVENOUS
  Administered 2022-06-13: 10 mg via INTRAVENOUS
  Administered 2022-06-13: 50 mg via INTRAVENOUS

## 2022-06-13 MED ORDER — SODIUM CHLORIDE 0.9 % IV SOLN
INTRAVENOUS | Status: AC
  Filled 2022-06-13: qty 2

## 2022-06-13 MED ORDER — BUPIVACAINE HCL (PF) 0.25 % IJ SOLN
INTRAMUSCULAR | Status: AC
  Filled 2022-06-13: qty 30

## 2022-06-13 MED ORDER — PROPOFOL 10 MG/ML IV BOLUS
INTRAVENOUS | Status: AC
  Filled 2022-06-13: qty 20

## 2022-06-13 MED ORDER — ROCURONIUM BROMIDE 100 MG/10ML IV SOLN
INTRAVENOUS | Status: DC | PRN
  Administered 2022-06-13: 50 mg via INTRAVENOUS
  Administered 2022-06-13 (×2): 10 mg via INTRAVENOUS

## 2022-06-13 MED ORDER — CHLORHEXIDINE GLUCONATE 0.12 % MT SOLN
OROMUCOSAL | Status: AC
  Administered 2022-06-13: 15 mL via OROMUCOSAL
  Filled 2022-06-13: qty 15

## 2022-06-13 MED ORDER — BUPIVACAINE-EPINEPHRINE 0.25% -1:200000 IJ SOLN
INTRAMUSCULAR | Status: DC | PRN
  Administered 2022-06-13: 40 mL

## 2022-06-13 MED ORDER — MIDAZOLAM HCL 2 MG/2ML IJ SOLN
0.5000 mg | Freq: Once | INTRAMUSCULAR | Status: AC
  Administered 2022-06-13: 0.5 mg via INTRAVENOUS

## 2022-06-13 MED ORDER — BUPIVACAINE LIPOSOME 1.3 % IJ SUSP
INTRAMUSCULAR | Status: AC
  Filled 2022-06-13: qty 10

## 2022-06-13 SURGICAL SUPPLY — 51 items
ADH SKN CLS APL DERMABOND .7 (GAUZE/BANDAGES/DRESSINGS) ×1
BAG PRESSURE INF REUSE 1000 (BAG) IMPLANT
CANNULA CAP OBTURATR AIRSEAL 8 (CAP) IMPLANT
CANNULA REDUC XI 12-8 STAPL (CANNULA) ×1
CANNULA REDUCER 12-8 DVNC XI (CANNULA) ×1 IMPLANT
CLIP LIGATING HEMO O LOK GREEN (MISCELLANEOUS) ×1 IMPLANT
DERMABOND ADVANCED .7 DNX12 (GAUZE/BANDAGES/DRESSINGS) ×1 IMPLANT
DRAPE ARM DVNC X/XI (DISPOSABLE) ×4 IMPLANT
DRAPE COLUMN DVNC XI (DISPOSABLE) ×1 IMPLANT
DRAPE DA VINCI XI ARM (DISPOSABLE) ×4
DRAPE DA VINCI XI COLUMN (DISPOSABLE) ×1
ELECT CAUTERY BLADE TIP 2.5 (TIP) ×1
ELECT REM PT RETURN 9FT ADLT (ELECTROSURGICAL) ×1
ELECTRODE CAUTERY BLDE TIP 2.5 (TIP) ×1 IMPLANT
ELECTRODE REM PT RTRN 9FT ADLT (ELECTROSURGICAL) ×1 IMPLANT
GLOVE SURG SYN 7.0 (GLOVE) ×2 IMPLANT
GLOVE SURG SYN 7.0 PF PI (GLOVE) ×2 IMPLANT
GLOVE SURG SYN 7.5  E (GLOVE) ×2
GLOVE SURG SYN 7.5 E (GLOVE) ×2 IMPLANT
GLOVE SURG SYN 7.5 PF PI (GLOVE) ×2 IMPLANT
GOWN STRL REUS W/ TWL LRG LVL3 (GOWN DISPOSABLE) ×4 IMPLANT
GOWN STRL REUS W/TWL LRG LVL3 (GOWN DISPOSABLE) ×4
IRRIGATOR SUCT 8 DISP DVNC XI (IRRIGATION / IRRIGATOR) IMPLANT
IRRIGATOR SUCTION 8MM XI DISP (IRRIGATION / IRRIGATOR) ×1
IV NS 1000ML (IV SOLUTION) ×1
IV NS 1000ML BAXH (IV SOLUTION) IMPLANT
KIT PINK PAD W/HEAD ARE REST (MISCELLANEOUS) ×1
KIT PINK PAD W/HEAD ARM REST (MISCELLANEOUS) ×1 IMPLANT
LABEL OR SOLS (LABEL) ×1 IMPLANT
MANIFOLD NEPTUNE II (INSTRUMENTS) ×1 IMPLANT
NEEDLE HYPO 22GX1.5 SAFETY (NEEDLE) ×1 IMPLANT
NS IRRIG 500ML POUR BTL (IV SOLUTION) ×1 IMPLANT
OBTURATOR OPTICAL STANDARD 8MM (TROCAR) ×1
OBTURATOR OPTICAL STND 8 DVNC (TROCAR) ×1
OBTURATOR OPTICALSTD 8 DVNC (TROCAR) ×1 IMPLANT
PACK LAP CHOLECYSTECTOMY (MISCELLANEOUS) ×1 IMPLANT
PENCIL SMOKE EVACUATOR (MISCELLANEOUS) ×1 IMPLANT
SEAL CANN UNIV 5-8 DVNC XI (MISCELLANEOUS) ×3 IMPLANT
SEAL XI 5MM-8MM UNIVERSAL (MISCELLANEOUS) ×2
SET TUBE FILTERED XL AIRSEAL (SET/KITS/TRAYS/PACK) IMPLANT
SET TUBE SMOKE EVAC HIGH FLOW (TUBING) ×1 IMPLANT
SOLUTION ELECTROLUBE (MISCELLANEOUS) ×1 IMPLANT
SPIKE FLUID TRANSFER (MISCELLANEOUS) ×1 IMPLANT
SPONGE T-LAP 18X18 ~~LOC~~+RFID (SPONGE) IMPLANT
STAPLER CANNULA SEAL DVNC XI (STAPLE) ×1 IMPLANT
STAPLER CANNULA SEAL XI (STAPLE) ×1
SUT MNCRL AB 4-0 PS2 18 (SUTURE) ×1 IMPLANT
SUT VICRYL 0 UR6 27IN ABS (SUTURE) ×2 IMPLANT
SYS BAG RETRIEVAL 10MM (BASKET) ×1
SYSTEM BAG RETRIEVAL 10MM (BASKET) ×1 IMPLANT
WATER STERILE IRR 500ML POUR (IV SOLUTION) ×1 IMPLANT

## 2022-06-13 NOTE — Anesthesia Preprocedure Evaluation (Signed)
Anesthesia Evaluation  Patient identified by MRN, date of birth, ID band Patient awake    Reviewed: Allergy & Precautions, NPO status , Patient's Chart, lab work & pertinent test results  Airway Mallampati: III  TM Distance: >3 FB Neck ROM: full    Dental  (+) Upper Dentures, Partial Lower   Pulmonary neg pulmonary ROS   Pulmonary exam normal breath sounds clear to auscultation       Cardiovascular Exercise Tolerance: Good hypertension, Pt. on medications + CAD and +CHF  negative cardio ROS Normal cardiovascular exam+ pacemaker  Rhythm:Regular Rate:Normal     Neuro/Psych negative neurological ROS  negative psych ROS   GI/Hepatic negative GI ROS, Neg liver ROS,,,  Endo/Other  negative endocrine ROSHypothyroidism    Renal/GU negative Renal ROS  negative genitourinary   Musculoskeletal   Abdominal Normal abdominal exam  (+)   Peds negative pediatric ROS (+)  Hematology negative hematology ROS (+) Blood dyscrasia, anemia   Anesthesia Other Findings Past Medical History: 03/12/2019: AICD (automatic cardioverter/defibrillator) present     Comment:  a.) Medtronic Claria MRI CRT-D No date: Anemia No date: Anginal pain (HCC) No date: Arthritis No date: Bilateral carotid artery stenosis No date: Chronic HFrEF (heart failure with reduced ejection fraction)  ([place])     Comment:  a.) TTE 04/21/2016: EF 30%, mod glob HL, mod LAE, triv               PR, mild AR/TR, sever MR, G1DD; b.) TTE 05/07/2018: EF               35%, mild LVH, mod glob HK, LAE, RVE, triv PR, mild               AR/TR, mod MR; c.) TTE 09/09/2019: ED 35%, LVH, glob HK,               BAE, RVE, triv PR, mild AR/TR, mod MR, G1DD. 03/31/2016: Coronary artery disease     Comment:  a.) LHC 03/31/2016: 50% p-mLCx, 30% dLCx, 45% D1-1, 50%               D1-2, 40% mLAD, 25% dLAD, 30% p-mRCA - med mgmt. No date: Dilated cardiomyopathy ([place])     Comment:  a.) LHC  03/31/2016: EF 55%; b.) TTE 04/21/2016: EF 30%;               c.) TTE 05/07/2018: EF 35%; d.)TTE 09/09/2019: ED 35% 01/23/2020: History of 2019 novel coronavirus disease (COVID-19) No date: Hyperlipemia No date: Hypertension No date: Hypothyroidism No date: LBBB (left bundle branch block) No date: Vitamin D deficiency  Past Surgical History: 03/31/2016: CARDIAC CATHETERIZATION; Left     Comment:  Procedure: Left Heart Cath and Coronary Angiography;                Surgeon: Corey Skains, MD;  Location: [place]               CV LAB;  Service: Cardiovascular;  Laterality: Left; 03/12/2019: CARDIAC DEFIBRILLATOR PLACEMENT     Comment:  Procedure: CARDIAC DEFIBRILLATOR PLACEMENT; Location:               [facility]; Surgeon: Mylinda Latina, MD No date: COLONOSCOPY     Comment:  x 2 02/26/2018: ESOPHAGOGASTRODUODENOSCOPY (EGD) WITH PROPOFOL; N/A     Comment:  Procedure: ESOPHAGOGASTRODUODENOSCOPY (EGD) WITH               PROPOFOL;  Surgeon: Alice Reichert, Benay Pike, MD;  Location:  [facility] ENDOSCOPY;  Service: Gastroenterology;  Laterality:               N/A; 04/25/2022: IR CHOLANGIOGRAM EXISTING TUBE 05/23/2022: IR EXCHANGE BILIARY DRAIN 05/30/2022: IR EXCHANGE BILIARY DRAIN 03/29/2022: IR PERC CHOLECYSTOSTOMY 04/25/2022: IR RADIOLOGIST EVAL & MGMT  BMI    Body Mass Index: 23.57 kg/m      Reproductive/Obstetrics negative OB ROS                             Anesthesia Physical Anesthesia Plan  ASA: 3  Anesthesia Plan: General   Post-op Pain Management:    Induction: Intravenous  PONV Risk Score and Plan: Ondansetron, Dexamethasone, Midazolam and Treatment may vary due to age or medical condition  Airway Management Planned: Oral ETT  Additional Equipment:   Intra-op Plan:   Post-operative Plan: Extubation in OR  Informed Consent: I have reviewed the patients History and Physical, chart, labs and discussed the procedure including the risks,  benefits and alternatives for the proposed anesthesia with the patient or authorized representative who has indicated his/her understanding and acceptance.     Dental Advisory Given  Plan Discussed with: CRNA and Surgeon  Anesthesia Plan Comments:        Anesthesia Quick Evaluation

## 2022-06-13 NOTE — Progress Notes (Signed)
Per Dr. Janeann Forehand ok to give another 5mg  po oxycodone for a total of 10mg .  Patient also states sometimes he has tremors, noted right upper arm at intervals.

## 2022-06-13 NOTE — Discharge Instructions (Signed)
AMBULATORY SURGERY  DISCHARGE INSTRUCTIONS   The drugs that you were given will stay in your system until tomorrow so for the next 24 hours you should not:  Drive an automobile Make any legal decisions Drink any alcoholic beverage   You may resume regular meals tomorrow.  Today it is better to start with liquids and gradually work up to solid foods.  You may eat anything you prefer, but it is better to start with liquids, then soup and crackers, and gradually work up to solid foods.   Please notify your doctor immediately if you have any unusual bleeding, trouble breathing, redness and pain at the surgery site, drainage, fever, or pain not relieved by medication.    Additional Instructions:  PLEASE DO NOT REMOVE GREEN ARMBAND FOR 4 DAYS    Please contact your physician with any problems or Same Day Surgery at [phone], Monday through Friday 6 am to 4 pm, or [place] [facility] [place] Main number at [phone]. 

## 2022-06-13 NOTE — Op Note (Signed)
  Procedure Date:  06/13/2022  Pre-operative Diagnosis:  Acute cholecystitis  Post-operative Diagnosis: Acute cholecystitis  Procedure:  Robotic assisted cholecystectomy with ICG FireFly cholangiogram  Surgeon:  Melvyn Neth, MD  Anesthesia:  General endotracheal  Estimated Blood Loss:  20 ml  Specimens:  gallbladder  Complications:  None  Indications for Procedure:  This is a 87 y.o. male with a history of acute cholecystitis, s/p percutaneous cholecystostomy drain placement, now presenting for cholecystectomy.  The benefits, complications, treatment options, and expected outcomes were discussed with the patient. The risks of bleeding, infection, recurrence of symptoms, failure to resolve symptoms, bile duct damage, bile duct leak, retained common bile duct stone, bowel injury, and need for further procedures were all discussed with the patient and he was willing to proceed.  Description of Procedure: The patient was correctly identified in the preoperative area and brought into the operating room.  The patient was placed supine with VTE prophylaxis in place.  Appropriate time-outs were performed.  Anesthesia was induced and the patient was intubated.  Appropriate antibiotics were infused.  The patient's drain suture was cut, curling suture was released, and draining bag disconnected.  The abdomen was prepped and draped in a sterile fashion. An infraumbilical incision was made. A cutdown technique was used to enter the abdominal cavity without injury, and a 12 mm robotic port was inserted.  Pneumoperitoneum was obtained with appropriate opening pressures.  Three 8-mm ports were placed in the mid abdomen at the level of the umbilicus under direct visualization.  The DaVinci platform was docked, camera targeted, and instruments were placed under direct visualization.  The gallbladder was identified.  There were adhesions of the omentum to the gallbladder and liver edge, and from the liver  to the anterior abdominal wall.  These were taken down using cautery.  The fundus was grasped and retracted cephalad.  Adhesions were lysed bluntly and with electrocautery. The infundibulum was grasped and retracted laterally, exposing the peritoneum overlying the gallbladder.  This was incised with electrocautery and extended on either side of the gallbladder.  FireFly cholangiogram was then obtained, and we were able to clearly identify the cystic duct and common bile duct.  The cystic duct and cystic artery were carefully dissected with combination of cautery and blunt dissection.  Both were clipped twice proximally and once distally, cutting in between.  The gallbladder was taken from the gallbladder fossa in a retrograde fashion with electrocautery.  The drain was pulled out of the gallbladder.  The gallbladder was placed in an Endocatch bag. The liver bed was inspected and any bleeding was controlled with electrocautery. The right upper quadrant was then inspected again revealing intact clips, no bleeding, and no ductal injury.  The area was thoroughly irrigated.  The 8 mm ports were removed under direct visualization and the 12 mm port was removed.  The Endocatch bag was brought out via the umbilical incision. The fascial opening was closed using 0 vicryl suture.  Local anesthetic was infused in all incisions and the incisions were closed with 4-0 Monocryl.  The wounds were cleaned and sealed with DermaBond.  The patient was emerged from anesthesia and extubated and brought to the recovery room for further management.  The patient tolerated the procedure well and all counts were correct at the end of the case.   Melvyn Neth, MD

## 2022-06-13 NOTE — Anesthesia Postprocedure Evaluation (Signed)
Anesthesia Post Note  Patient: Joel Spencer  Procedure(s) Performed: XI ROBOTIC ASSISTED LAPAROSCOPIC CHOLECYSTECTOMY INDOCYANINE GREEN FLUORESCENCE IMAGING (ICG)  Anesthesia Type: General Pain management: pain level not controlled Vital Signs Assessment: post-procedure vital signs reviewed and stable Respiratory status: spontaneous breathing Cardiovascular status: stable Anesthetic complications: no  No notable events documented.   Last Vitals:  Vitals:   06/13/22 1245 06/13/22 1330  BP: (!) 145/85 (!) 127/99  Pulse: 87 95  Resp: 13 (!) 22  Temp:  (!) 36.4 C  SpO2: 97% 97%    Last Pain:  Vitals:   06/13/22 1330  TempSrc:   PainSc: 7                  VAN STAVER[place]

## 2022-06-13 NOTE — Progress Notes (Signed)
Dr. Janeann Forehand at bedside, "continue to [facility]"  Patient remains very sleepy, pupils 35mm, reactive.

## 2022-06-13 NOTE — Transfer of Care (Signed)
Immediate Anesthesia Transfer of Care Note  Patient: Joel Spencer  Procedure(s) Performed: XI ROBOTIC ASSISTED LAPAROSCOPIC CHOLECYSTECTOMY INDOCYANINE GREEN FLUORESCENCE IMAGING (ICG)  Patient Location: PACU  Anesthesia Type:General  Level of Consciousness: drowsy  Airway & Oxygen Therapy: Patient Spontanous Breathing and Patient connected to face mask oxygen  Post-op Assessment: Report given to RN and Post -op Vital signs reviewed and stable  Post vital signs: Reviewed and stable  Last Vitals:  Vitals Value Taken Time  BP 151/89 06/13/22 1201  Temp    Pulse 89 06/13/22 1206  Resp 14 06/13/22 1206  SpO2 99 % 06/13/22 1206  Vitals shown include unvalidated device data.  Last Pain:  Vitals:   06/13/22 0839  TempSrc: Temporal  PainSc: 0-No pain         Complications: No notable events documented.

## 2022-06-13 NOTE — Progress Notes (Signed)
Patient awake/alert x4. Up into recliner, did well transfering from bed to chair. Asking for more pain medication. Anesthesia made aware.  Tolerated gingerale and crackers without event.

## 2022-06-13 NOTE — Anesthesia Procedure Notes (Signed)
Procedure Name: Intubation Date/Time: 06/13/2022 9:46 AM  Performed by: Adalberto Ill, CRNAPre-anesthesia Checklist: Patient identified, Emergency Drugs available, Suction available, Patient being monitored and Timeout performed Patient Re-evaluated:Patient Re-evaluated prior to induction Oxygen Delivery Method: Circle system utilized Preoxygenation: Pre-oxygenation with 100% oxygen Induction Type: IV induction Ventilation: Mask ventilation without difficulty Laryngoscope Size: Miller and 2 Grade View: Grade I Tube type: Oral Tube size: 7.0 mm Number of attempts: 1 (brief atrauamtic) Airway Equipment and Method: Stylet Placement Confirmation: ETT inserted through vocal cords under direct vision, positive ETCO2 and breath sounds checked- equal and bilateral Secured at: 22 cm Tube secured with: Tape Dental Injury: Teeth and Oropharynx as per pre-operative assessment

## 2022-06-13 NOTE — Progress Notes (Signed)
Patient arrived to pacu: sleepy. Received total of 255mcq of fentanyl IV during OR case.  Did open oral cavity to place upper and lower dentures. Per Dr. Janeann Forehand continue to monitor.

## 2022-06-13 NOTE — Progress Notes (Signed)
Patient awaken "yelling in pain"   Dr. Janeann Forehand at bedside, ordered 0.5mg  versed IV and 37mcq fentanyl, slightly effective.  Patient awake, aware of surroundings, moaning, states he is having pain.  Will continue monitor.

## 2022-06-13 NOTE — Interval H&P Note (Signed)
History and Physical Interval Note:  06/13/2022 8:35 AM  Joel Spencer  has presented today for surgery, with the diagnosis of acute cholecystitis.  The various methods of treatment have been discussed with the patient and family. After consideration of risks, benefits and other options for treatment, the patient has consented to  Procedure(s): XI ROBOTIC ASSISTED LAPAROSCOPIC CHOLECYSTECTOMY (N/A) [place] (ICG) (N/A) as a surgical intervention.  The patient's history has been reviewed, patient examined, no change in status, stable for surgery.  I have reviewed the patient's chart and labs.  Questions were answered to the patient's satisfaction.     [name]

## 2022-06-14 LAB — SURGICAL PATHOLOGY

## 2022-06-27 ENCOUNTER — Ambulatory Visit (INDEPENDENT_AMBULATORY_CARE_PROVIDER_SITE_OTHER): Payer: Medicare HMO | Admitting: Physician Assistant

## 2022-06-27 ENCOUNTER — Encounter: Payer: Self-pay | Admitting: Physician Assistant

## 2022-06-27 ENCOUNTER — Other Ambulatory Visit: Payer: Self-pay

## 2022-06-27 VITALS — BP 130/85 | HR 86 | Temp 97.7°F | Ht 67.5 in | Wt 155.0 lb

## 2022-06-27 DIAGNOSIS — Z09 Encounter for follow-up examination after completed treatment for conditions other than malignant neoplasm: Secondary | ICD-10-CM

## 2022-06-27 DIAGNOSIS — K810 Acute cholecystitis: Secondary | ICD-10-CM

## 2022-06-27 NOTE — Progress Notes (Signed)
[facility] POST-OP OFFICE VISIT  06/27/2022  HPI: Joel Spencer is a 87 y.o. male 14 days s/p robotic assisted laparoscopic cholecystectomy for acute cholecystitis with Dr Kirke Corin   Doing well Biggest complaint is itchiness; he is using rubbing alcohol on his skin daily No fever, chills, nausea, emesis Abdominal pain has subsided No issues with PO intake No other complaints   Vital signs: BP 130/85   Pulse 86   Temp 97.7 F (36.5 C) (Oral)   Ht 5' 7.5" (1.715 m)   Wt 155 lb (70.3 kg)   SpO2 98%   BMI 23.92 kg/m    Physical Exam: Constitutional: Well appearing male, NAD Abdomen: Soft, non-tender, non-distended, no rebound/guarding Skin: Laparoscopic incisions are healing well, no erythema or drainage   Assessment/Plan: This is a 87 y.o. male 14 days s/p robotic assisted laparoscopic cholecystectomy for acute cholecystitis with Dr Kirke Corin    - Pain control prn  - Reviewed wound care recommendation  - Reviewed lifting restrictions; 4 weeks total  - Reviewed surgical pathology; Acute Cholecystitis   - He can follow up on as needed basis; He understands to call with questions/concerns  -- [name], PA-C [facility] 06/27/2022, 2:10 PM M-F: 7am - 4pm

## 2022-06-27 NOTE — Patient Instructions (Signed)

## 2022-07-18 ENCOUNTER — Ambulatory Visit: Admission: RE | Admit: 2022-07-18 | Payer: Medicare HMO | Source: Ambulatory Visit | Admitting: Radiology

## 2022-07-18 HISTORY — DX: Left bundle-branch block, unspecified: I44.7

## 2022-07-18 HISTORY — DX: Vitamin D deficiency, unspecified: E55.9

## 2022-07-18 HISTORY — DX: Dilated cardiomyopathy: I42.0

## 2022-07-18 HISTORY — DX: Occlusion and stenosis of bilateral carotid arteries: I65.23

## 2022-07-21 ENCOUNTER — Other Ambulatory Visit: Payer: Self-pay | Admitting: Internal Medicine

## 2022-07-21 DIAGNOSIS — Z9049 Acquired absence of other specified parts of digestive tract: Secondary | ICD-10-CM

## 2022-07-21 DIAGNOSIS — R1011 Right upper quadrant pain: Secondary | ICD-10-CM

## 2022-07-28 ENCOUNTER — Ambulatory Visit
Admission: RE | Admit: 2022-07-28 | Discharge: 2022-07-28 | Disposition: A | Discharge status: Home / Self Care | Payer: Medicare HMO | Source: Ambulatory Visit | Attending: Internal Medicine | Admitting: Internal Medicine

## 2022-07-28 DIAGNOSIS — Z9049 Acquired absence of other specified parts of digestive tract: Secondary | ICD-10-CM | POA: Diagnosis not present

## 2022-07-28 DIAGNOSIS — R1011 Right upper quadrant pain: Secondary | ICD-10-CM | POA: Diagnosis present

## 2023-02-27 ENCOUNTER — Ambulatory Visit: Payer: Medicare HMO | Admitting: Dermatology

## 2023-02-27 ENCOUNTER — Encounter: Payer: Self-pay | Admitting: Dermatology

## 2023-02-27 VITALS — BP 149/85 | HR 66

## 2023-02-27 DIAGNOSIS — R208 Other disturbances of skin sensation: Secondary | ICD-10-CM

## 2023-02-27 DIAGNOSIS — L570 Actinic keratosis: Secondary | ICD-10-CM

## 2023-02-27 DIAGNOSIS — W908XXA Exposure to other nonionizing radiation, initial encounter: Secondary | ICD-10-CM | POA: Diagnosis not present

## 2023-02-27 DIAGNOSIS — Z85828 Personal history of other malignant neoplasm of skin: Secondary | ICD-10-CM | POA: Diagnosis not present

## 2023-02-27 NOTE — Patient Instructions (Addendum)
Get from [facility] or [employer] [facility]   From [facility]   Recommend daily broad spectrum sunscreen SPF 30+ to sun-exposed areas, reapply every 2 hours as needed. Call for new or changing lesions.  Staying in the shade or wearing long sleeves, sun glasses (UVA+UVB protection) and wide brim hats (4-inch brim around the entire circumference of the hat) are also recommended for sun protection.    Gentle Skin Care Guide  1. Bathe no more than once a day.  2. Avoid bathing in hot water  3. Use a mild soap like Dove, Vanicream, Cetaphil, CeraVe. Can use Lever 2000 or Cetaphil antibacterial soap  4. Use soap only where you need it. On most days, use it under your arms, between your legs, and on your feet. Let the water rinse other areas unless visibly dirty.  5. When you get out of the bath/shower, use a towel to gently blot your skin dry, don't rub it.  6. While your skin is still a little damp, apply a moisturizing cream such as Vanicream, CeraVe, Cetaphil, Eucerin, Sarna lotion or plain Vaseline Jelly. For hands apply Neutrogena [place] Hand Cream or Excipial Hand Cream.  7. Reapply moisturizer any time you start to itch or feel dry.  8. Sometimes using free and clear laundry detergents can be helpful. Fabric softener sheets should be avoided. Downy Free & Gentle liquid, or any liquid fabric softener that is free of dyes and perfumes, it acceptable to use  9. If your doctor has given you prescription creams you may apply moisturizers over them      Due to recent changes in healthcare laws, you may see results of your pathology and/or laboratory studies on MyChart before the doctors have had a chance to review them. We understand that in some cases there may be results that are confusing or concerning to you. Please understand that not all results are received at the same time and often the doctors may need to interpret multiple results in order to provide you with the best plan of care or  course of treatment. Therefore, we ask that you please give [place] 2 business days to thoroughly review all your results before contacting the office for clarification. Should we see a critical lab result, you will be contacted sooner.   If You Need Anything After Your Visit  If you have any questions or concerns for your doctor, please call our main line at [phone] and press option 4 to reach your doctor's medical assistant. If no one answers, please leave a voicemail as directed and we will return your call as soon as possible. Messages left after 4 pm will be answered the following business day.   You may also send [place] a message via MyChart. We typically respond to MyChart messages within 1-2 business days.  For prescription refills, please ask your pharmacy to contact our office. Our fax number is [phone].  If you have an urgent issue when the clinic is closed that cannot wait until the next business day, you can page your doctor at the number below.    Please note that while we do our best to be available for urgent issues outside of office hours, we are not available 24/7.   If you have an urgent issue and are unable to reach [place], you may choose to seek medical care at your doctor's office, retail clinic, urgent care center, or emergency room.  If you have a medical emergency, please immediately call 911 or go  to the emergency department.  Pager Numbers  - Dr. Gwen Pounds: [phone]  - Dr. Roseanne Reno: [phone]  - Dr. Katrinka Blazing: [phone]   In the event of inclement weather, please call our main line at [phone] for an update on the status of any delays or closures.  Dermatology Medication Tips: Please keep the boxes that topical medications come in in order to help keep track of the instructions about where and how to use these. Pharmacies typically print the medication instructions only on the boxes and not directly on the medication tubes.   If your medication is too  expensive, please contact our office at [phone] option 4 or send [place] a message through MyChart.   We are unable to tell what your co-pay for medications will be in advance as this is different depending on your insurance coverage. However, we may be able to find a substitute medication at lower cost or fill out paperwork to get insurance to cover a needed medication.   If a prior authorization is required to get your medication covered by your insurance company, please allow [place] 1-2 business days to complete this process.  Drug prices often vary depending on where the prescription is filled and some pharmacies may offer cheaper prices.  The website www.goodrx.com contains coupons for medications through different pharmacies. The prices here do not account for what the cost may be with help from insurance (it may be cheaper with your insurance), but the website can give you the price if you did not use any insurance.  - You can print the associated coupon and take it with your prescription to the pharmacy.  - You may also stop by our office during regular business hours and pick up a GoodRx coupon card.  - If you need your prescription sent electronically to a different pharmacy, notify our office through [facility] or by phone at [phone] option 4.     Si Usted Necesita Algo Despus de Su Visita  Tambin puede enviarnos un mensaje a travs de Clinical cytogeneticist. Por lo general respondemos a los mensajes de MyChart en el transcurso de 1 a 2 das hbiles.  Para renovar recetas, por favor pida a su farmacia que se ponga en contacto con nuestra oficina. Annie Sable de fax es [place] [phone].  Si tiene un asunto urgente cuando la clnica est cerrada y que no puede esperar hasta el siguiente da hbil, puede llamar/localizar a su doctor(a) al nmero que aparece a continuacin.   Por favor, tenga en cuenta que aunque hacemos todo lo posible para estar disponibles para asuntos urgentes fuera  del horario de [place], no estamos disponibles las 24 horas del da, los 7 [address]  [address] de la [place].   Si tiene un problema urgente y no puede comunicarse con nosotros, puede optar por buscar atencin mdica  en el consultorio de su doctor(a), en una clnica privada, en un centro de atencin urgente o en una sala de emergencias.  Si tiene Engineer, drilling, por favor llame inmediatamente al 911 o vaya a la sala de emergencias.  Nmeros de bper  - Dr. Gwen Pounds: [phone]  - Dra. Roseanne Reno: [phone]  - Dr. Katrinka Blazing: [phone]   En caso de inclemencias del tiempo, por favor llame a Lacy Duverney principal al [phone] para una actualizacin sobre el [place] de cualquier retraso o cierre.  Consejos para la medicacin en dermatologa: Por favor, guarde las cajas en las que vienen los medicamentos de uso tpico para ayudarle a seguir las instrucciones sobre dnde  y cmo usarlos. Las farmacias generalmente imprimen las instrucciones del medicamento slo en las cajas y no directamente en los tubos del [place].   Si su medicamento es muy caro, por favor, pngase en contacto con Rolm [place]ala llamando al [phone] y presione la opcin 4 o envenos un mensaje a travs de Clinical cytogeneticist.   No podemos decirle cul ser su copago por los medicamentos por adelantado ya que esto es diferente dependiendo de la cobertura de su seguro. Sin embargo, es posible que podamos encontrar un medicamento sustituto a Audiological scientist un formulario para que el seguro cubra el medicamento que se considera necesario.   Si se requiere una autorizacin previa para que su compaa de seguros [place] su medicamento, por favor permtanos de 1 a 2 das hbiles para completar [address]  Los precios de los medicamentos varan con frecuencia dependiendo del Environmental consultant de dnde se surte la receta y alguna farmacias pueden ofrecer precios ms baratos.  El sitio web www.goodrx.com tiene cupones para medicamentos de  Health and safety inspector. Los precios aqu no tienen en cuenta lo que podra costar con la ayuda del seguro (puede ser ms barato con su seguro), pero el sitio web puede darle el precio si no utiliz Tourist information centre manager.  - Puede imprimir el cupn correspondiente y llevarlo con su receta a la farmacia.  - Tambin puede pasar por nuestra oficina durante el horario de atencin regular y Education officer, museum una tarjeta de cupones de GoodRx.  - Si necesita que su receta se enve electrnicamente a una farmacia diferente, informe a nuestra oficina a travs de MyChart de [place] o por telfono llamando al [phone] y presione la opcin 4.

## 2023-02-27 NOTE — Progress Notes (Signed)
   New Patient Visit   Subjective  Joel Spencer is a 87 y.o. male who presents for the following: Spots on scalp. Raised, rough. Irritated. Itches all the time. Has had LN2 treatment in the past. States he has been to several skin doctors and no one can fix it. Has used different prescription treatments. Rubbing alcohol helps most with itching.  Hx of skin cancer on left dorsal hand. Treated last year by Dr. Ebony Cargo.   The patient has spots, moles and lesions to be evaluated, some may be new or changing and the patient may have concern these could be cancer.  Wife is with patient and contributes to history.   The following portions of the chart were reviewed this encounter and updated as appropriate: medications, allergies, medical history  Review of Systems:  No other skin or systemic complaints except as noted in HPI or Assessment and Plan.  Objective  Well appearing patient in no apparent distress; mood and affect are within normal limits.  A focused examination was performed of the following areas: Scalp, face, hands.   Relevant physical exam findings are noted in the Assessment and Plan.  Scalp Normal healthy appearing skin on scalp exam    Assessment & Plan   Dysesthesia of scalp Scalp  Advised not a skin issue. No lesions or dermatitis that would explain pruritus. Symptoms are most likely related to nerves/cervical spine issues. 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 Surgery was recommended to patient during that evaluation, but patient declined  Recommend Lidocaine patches and Sarna lotion with menthol as needed and per label instructions to "distract" the nerves and hopefully provide relief. Gave photos of products   ACTINIC KERATOSIS Exam: Erythematous thin papules/macules with gritty scale at the right temple.  Actinic keratoses are precancerous spots that appear secondary to cumulative UV radiation exposure/sun exposure over time.  They are chronic with expected duration over 1 year. A portion of actinic keratoses will progress to squamous cell carcinoma of the skin. It is not possible to reliably predict which spots will progress to skin cancer and so treatment is recommended to prevent development of skin cancer.  Recommend daily broad spectrum sunscreen SPF 30+ to sun-exposed areas, reapply every 2 hours as needed.  Recommend staying in the shade or wearing long sleeves, sun glasses (UVA+UVB protection) and wide brim hats (4-inch brim around the entire circumference of the hat). Call for new or changing lesions.  Treatment Plan: Prefers to watch for now.     Return if symptoms worsen or fail to improve.  I, Lawson Radar, CMA, am acting as scribe for Elie Goody, MD.   Documentation: I have reviewed the above documentation for accuracy and completeness, and I agree with the above.  Elie Goody, MD

## 2023-08-07 ENCOUNTER — Ambulatory Visit: Payer: Medicare HMO | Admitting: Dermatology
